# Patient Record
Sex: Female | Born: 1970 | Race: Black or African American | Hispanic: No | Marital: Married | Smoking: Current some day smoker
Health system: Southern US, Community
[De-identification: ages and names within clinical notes are randomized; demographics above are authoritative.]

## PROBLEM LIST (undated history)

## (undated) ENCOUNTER — Ambulatory Visit

## (undated) DIAGNOSIS — Z319 Encounter for procreative management, unspecified: Secondary | ICD-10-CM

## (undated) DIAGNOSIS — I10 Essential (primary) hypertension: Secondary | ICD-10-CM

## (undated) DIAGNOSIS — N96 Recurrent pregnancy loss: Principal | ICD-10-CM

## (undated) DIAGNOSIS — R760 Raised antibody titer: Secondary | ICD-10-CM

## (undated) DIAGNOSIS — D219 Benign neoplasm of connective and other soft tissue, unspecified: Secondary | ICD-10-CM

## (undated) HISTORY — DX: Raised antibody titer: R76.0

## (undated) HISTORY — DX: Recurrent pregnancy loss: N96

## (undated) HISTORY — PX: UTERINE FIBROID SURGERY: SHX826

## (undated) HISTORY — PX: TOOTH EXTRACTION: SUR596

---

## 2004-11-28 ENCOUNTER — Emergency Department (HOSPITAL_COMMUNITY): Admission: EM | Admit: 2004-11-28 | Discharge: 2004-11-28 | Payer: Self-pay | Admitting: Emergency Medicine

## 2005-11-26 ENCOUNTER — Other Ambulatory Visit: Admission: RE | Admit: 2005-11-26 | Discharge: 2005-11-26 | Payer: Self-pay | Admitting: Obstetrics and Gynecology

## 2005-12-29 ENCOUNTER — Ambulatory Visit (HOSPITAL_COMMUNITY): Admission: RE | Admit: 2005-12-29 | Discharge: 2005-12-29 | Payer: Self-pay | Admitting: Obstetrics and Gynecology

## 2005-12-29 ENCOUNTER — Encounter (INDEPENDENT_AMBULATORY_CARE_PROVIDER_SITE_OTHER): Payer: Self-pay | Admitting: *Deleted

## 2006-04-20 ENCOUNTER — Emergency Department (HOSPITAL_COMMUNITY): Admission: EM | Admit: 2006-04-20 | Discharge: 2006-04-20 | Payer: Self-pay | Admitting: Emergency Medicine

## 2006-04-28 ENCOUNTER — Emergency Department (HOSPITAL_COMMUNITY): Admission: EM | Admit: 2006-04-28 | Discharge: 2006-04-28 | Payer: Self-pay | Admitting: Family Medicine

## 2006-07-18 ENCOUNTER — Emergency Department (HOSPITAL_COMMUNITY): Admission: EM | Admit: 2006-07-18 | Discharge: 2006-07-18 | Payer: Self-pay | Admitting: Family Medicine

## 2008-10-09 ENCOUNTER — Emergency Department (HOSPITAL_COMMUNITY): Admission: EM | Admit: 2008-10-09 | Discharge: 2008-10-09 | Payer: Self-pay | Admitting: Emergency Medicine

## 2010-03-06 ENCOUNTER — Emergency Department (HOSPITAL_BASED_OUTPATIENT_CLINIC_OR_DEPARTMENT_OTHER)
Admission: EM | Admit: 2010-03-06 | Discharge: 2010-03-06 | Payer: Self-pay | Source: Home / Self Care | Admitting: Emergency Medicine

## 2010-06-01 LAB — URINE MICROSCOPIC-ADD ON

## 2010-06-01 LAB — URINALYSIS, ROUTINE W REFLEX MICROSCOPIC
Bilirubin Urine: NEGATIVE
Glucose, UA: NEGATIVE mg/dL
Protein, ur: NEGATIVE mg/dL

## 2010-06-01 LAB — PREGNANCY, URINE: Preg Test, Ur: NEGATIVE

## 2010-08-07 NOTE — H&P (Signed)
Brenda Bowen, Brenda Bowen                ACCOUNT NO.:  [id number]   MEDICAL RECORD NO.:  [id number]          PATIENT TYPE:  AMB   LOCATION:  SDC                           FACILITY:  [facility]   PHYSICIAN:  Brenda A. Dillard, M.D. DATE OF BIRTH:  1970-12-13   DATE OF ADMISSION:  DATE OF DISCHARGE:                                HISTORY & PHYSICAL   CHIEF COMPLAINT:  Menometrorrhagia and uterine fibroid.   HISTORY:  The patient is a 40 year old African-American female, gravida 0  who presented complaining of irregular vaginal bleeding and infertility.  The patient states that she was soaking about a pad every hour and has some  weakness and occasional shortness of breath with exertion.  She had taken  Tylenol for the cramping. The patient any oral contraception.  She does have  a history of fibroid.  She denies any menopausal symptoms, vaginal  discharge.  She does have abdominal cramping with her periods.  The patient  had an endometrial biopsy which was found to be benign on 12/13/05 and was  given a pill taper and the pill taper did slow down her bleeding, but she  still continues to have bleeding in spite of taking the pill taper.  The  patient had an ultrasound on 12/22/05 which revealed the uterus to measure  10.2 x 5.5 x 7.1  The two ovaries were normal.  Two fibroids, one anterior  measuring 2.69, one posterior measuring 2.52 and both destroyed the  endometrial lining.  The patient was advised to have  D&C, hysterectomy,  possible myomectomy in order to help stop the bleeding.   PAST MEDICAL HISTORY:  Significant for polycystic ovarian syndrome.   PAST SURGICAL HISTORY:  Unremarkable.   SOCIAL HISTORY:  Positive for tobacco use, half a pack a day.  Occasional  alcohol use.  No illicit drug use.   REVIEW OF SYSTEMS:  Cardiovascular;  Denies any heart palpitations  Endocrine:  Denies history of diabetes.  Respiratory:  Unremarkable.  No  asthma.  GI:  Remarkable for IBS.  Musculoskeletal:   Remarkable for  weakness.  The patient has never seen a psychiatrist for anything.   LABORATORY DATA:  Last hemoglobin was 13.0.  The patient is insulin-  resistant.  TSH and prolactin were normal.   PHYSICAL EXAMINATION:  VITAL SIGNS:  Blood pressure 120/80.  HEENT:  Pupils are equal. Hearing is normal.  Throat is clear.  Thyroid is  not enlarged.  HEART:  Regular rate and rhythm.  LUNGS:  Clear to auscultation bilaterally.  BREASTS:  No masses, discharge, skin changes or nipple retraction.  BACK:  No CVA tenderness bilaterally.  ABDOMEN:  Nontender without masses or organomegaly.  EXTREMITIES:  No clubbing, cyanosis or edema.  VAGINAL EXAM:  There is moderate vaginal bleeding. Vagina and cervix normal,  nontender, without lesions.  Uterus top normal size, nontender.  Adnexa with  no masses and nontender.   ASSESSMENT:  Menometrorrhagia and fibroids.   PLAN:  Endometrial biopsy was benign.  Plan D&C and hysteroscopy, possible  hysteroscopic myomectomy.  The patient that the risks are, but  not limited  to, bleeding, infection, Asherman syndrome, perforation of the uterus and  infertility.      Brenda Bowen, M.D.  Electronically Signed     NAD/MEDQ  D:  12/28/2005  T:  12/29/2005  Job:  [id number]

## 2010-08-07 NOTE — Op Note (Signed)
NAMECAYLEIGH, PAULL                ACCOUNT NO.:  [id number]   MEDICAL RECORD NO.:  [id number]          PATIENT TYPE:  AMB   LOCATION:  SDC                           FACILITY:  [facility]   PHYSICIAN:  Naima A. Dillard, M.D. DATE OF BIRTH:  March 29, 1970   DATE OF PROCEDURE:  12/29/2005  DATE OF DISCHARGE:                                 OPERATIVE REPORT   PREOPERATIVE DIAGNOSIS:  Menometrorrhagia with uterine fibroids.   POSTOPERATIVE DIAGNOSIS:  Menometrorrhagia with uterine fibroids.   PROCEDURE:  Dilatation and curettage, hysteroscopy.   SURGEON:  Naima A. Dillard, M.D.   ASSISTANT:  None.   ANESTHESIA:  General laryngeal mask airway and local anesthesia.   SPECIMENS:  Endometrial curettings, sent to pathology.   ESTIMATED BLOOD LOSS:  Minimal.   COMPLICATIONS:  None.  Patient to PACU in stable condition.   PROCEDURE IN DETAIL:  Patient was taken to the operating room.  She was  placed in the dorsal lithotomy position, prepped and draped in the normal  sterile fashion.  Bladder was drained with a straight catheter.  A bivalve  speculum was placed into the vagina, and the anterior lip of the cervix was  grasped with a single-tooth tenaculum.  Lidocaine 1% 10 cc was used for a  cervical block.  The uterus was sounded to 9 cm.  The cervix was further  dilated with Pratt dilators up to 21.  Hysteroscope was placed into the  uterine cavity.  Both ostia were visualized.  There was a slight concave  aspect to the anterior wall of the uterus.  No polyps were seen, and the  remaining wall of the uterus seems to be normal.  I was unsure if this was  part of the submucosal aspect of the fibroid or not, so the hysteroscope was  removed.  A sharp curettage was done.  I could not feel any difference in  the contour.  After the curettage, I did look again, and the contour of the  anterior wall was different, it was flat and did not appear to be a  submucosal fibroid.  So the hysteroscope was  removed.  Then another  curettage was done until a gritty texture was noted.  All instruments were  removed from the vagina.  The tenaculum was removed from the cervix with  hemostasis noted.  Sponge, lap, and needle counts were correct.  Patient  went to the recovery room in stable condition.      Naima A. Normand Sloop, M.D.  Electronically Signed     NAD/MEDQ  D:  12/29/2005  T:  12/30/2005  Job:  [id number]

## 2011-02-05 ENCOUNTER — Encounter (HOSPITAL_COMMUNITY): Payer: Self-pay | Admitting: *Deleted

## 2011-02-05 ENCOUNTER — Inpatient Hospital Stay (HOSPITAL_COMMUNITY)
Admission: AD | Admit: 2011-02-05 | Discharge: 2011-02-06 | Disposition: A | Discharge status: Home / Self Care | Payer: BC Managed Care – PPO | Source: Ambulatory Visit | Attending: Obstetrics & Gynecology | Admitting: Obstetrics & Gynecology

## 2011-02-05 ENCOUNTER — Emergency Department (HOSPITAL_COMMUNITY)
Admission: EM | Admit: 2011-02-05 | Discharge: 2011-02-05 | Disposition: A | Discharge status: Home Health | Payer: BC Managed Care – PPO | Source: Home / Self Care

## 2011-02-05 DIAGNOSIS — O200 Threatened abortion: Secondary | ICD-10-CM | POA: Insufficient documentation

## 2011-02-05 DIAGNOSIS — B9689 Other specified bacterial agents as the cause of diseases classified elsewhere: Secondary | ICD-10-CM

## 2011-02-05 DIAGNOSIS — N760 Acute vaginitis: Secondary | ICD-10-CM

## 2011-02-05 DIAGNOSIS — O26899 Other specified pregnancy related conditions, unspecified trimester: Secondary | ICD-10-CM

## 2011-02-05 DIAGNOSIS — O209 Hemorrhage in early pregnancy, unspecified: Secondary | ICD-10-CM

## 2011-02-05 HISTORY — DX: Encounter for procreative management, unspecified: Z31.9

## 2011-02-05 HISTORY — DX: Essential (primary) hypertension: I10

## 2011-02-05 HISTORY — DX: Benign neoplasm of connective and other soft tissue, unspecified: D21.9

## 2011-02-05 LAB — WET PREP, GENITAL: Trich, Wet Prep: NONE SEEN

## 2011-02-05 LAB — HCG, QUANTITATIVE, PREGNANCY: hCG, Beta Chain, Quant, S: 3494 m[IU]/mL — ABNORMAL HIGH (ref <5)

## 2011-02-05 NOTE — Progress Notes (Signed)
PT SAYS  ON Thursday- - HAD ABD CRAMPS - THOUGHT CYCLE WAS STARTING- HAD BEEN SPOTTING. .   THEN TODAY- STILL SPOTTING-  - ALTHOUGH THIS AM WAS  HEAVY LIKE CYCLE- BUT LOOKED DIFFERENT.   THEN STOPPED.  THIS AFTERNOON- WENT -DID HOME UPT- POSTIVE.  FELT DIZZY X2 WEEKS- AND HUSBAND DIZZY TOO.  DENIES N/V/D. .    NOW IN TRIAGE-  SAYS BLEEDING IS LIGHT - HAS ON PAD.

## 2011-02-05 NOTE — Progress Notes (Signed)
WAS SEEN   AT [facility] IN [place]

## 2011-02-05 NOTE — ED Notes (Signed)
Patient LWBS

## 2011-02-05 NOTE — ED Provider Notes (Signed)
History   Brenda Bowen is a 40 y.o. year old [id number] female at [redacted]w[redacted]d weeks gestation who presents to [facility] reporting Vaginal bleeding since yesterday.  CSN: [id number] Arrival date & time: 02/05/2011  8:15 PM  HPI  Past Medical History  Diagnosis Date  . Fibroid   . Headache   . Hypertension   . Infertility management     Past Surgical History  Procedure Date  . Tooth extraction   . Uterine fibroid surgery     No family history on file.  History  Substance Use Topics  . Smoking status: Current Everyday Smoker -- 0.5 packs/day  . Smokeless tobacco: Not on file  . Alcohol Use: No    OB History    Grav Para Term Preterm Abortions TAB SAB Ect Mult Living   2    1 1     0      Review of Systems: Denies cramping or passage of tissue.  Allergies  Review of patient's allergies indicates no known allergies.  Home Medications   Current Outpatient Rx  Name Route Sig Dispense Refill  . IBUPROFEN 200 MG PO TABS Oral Take 200 mg by mouth every 6 (six) hours as needed. Takes for pain       BP 153/94  Pulse 88  Temp(Src) 98.8 F (37.1 C) (Oral)  Resp 18  Ht 5\' 2"  (1.575 m)  Wt 82.555 kg (182 lb)  BMI 33.29 kg/m2  LMP 12/26/2010    Physical Exam:  Physical Examination: General appearance - alert, well appearing, and in no distress and oriented to person, place, and time Abdomen - soft, nontender, nondistended, no masses or organomegaly Pelvic -   VULVA: normal appearing vulva with no masses, tenderness or lesions,   VAGINA: normal appearing vagina with small amount of pinkish-brown discharge, no lesions,   CERVIX: normal appearing cervix without discharge or lesions, cervical motion tenderness absent, nulliparous os,  UTERUS: uterus is normal size, shape, consistency and nontender,   ADNEXA: normal adnexa in size, nontender and no masses   ED Course  Procedures (including critical care time)  Results for orders placed during the hospital encounter of 02/05/11  (from the past 48 hour(s))  POCT PREGNANCY, URINE     Status: Normal   Collection Time   02/05/11  9:24 PM      Component Value Range Comment   Preg Test, Ur POSITIVE     ABO/RH     Status: Normal   Collection Time   02/05/11  9:42 PM      Component Value Range Comment   ABO/RH(D) A POS     WET PREP, GENITAL     Status: Abnormal   Collection Time   02/05/11 10:15 PM      Component Value Range Comment   Yeast, Wet Prep NONE SEEN  NONE SEEN     Trich, Wet Prep NONE SEEN  NONE SEEN     Clue Cells, Wet Prep FEW (*) NONE SEEN     WBC, Wet Prep HPF POC FEW (*) NONE SEEN  NONE SEEN                           HCG, QUANTITATIVE, PREGNANCY     Status: Abnormal   Collection Time   02/05/11 10:19 PM      Component Value Range Comment   hCG, Beta Chain, Quant, S 3494 (*) <5 (mIU/mL)      MDM  Assessment:  1. 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 Threatened AB  Plan: 1. D/C home per consult w/ Dr. Tamela Oddi 2. F/U w/ Dr. Tamela Oddi next week 3. SAB precautions  Katrinka Blazing, Jahlil Ziller 02/06/2011 1:55 AM

## 2011-02-06 ENCOUNTER — Inpatient Hospital Stay (HOSPITAL_COMMUNITY): Payer: BC Managed Care – PPO

## 2011-02-06 DIAGNOSIS — O209 Hemorrhage in early pregnancy, unspecified: Secondary | ICD-10-CM

## 2011-02-06 LAB — POCT PREGNANCY, URINE: Preg Test, Ur: POSITIVE

## 2011-02-06 MED ORDER — METRONIDAZOLE 500 MG PO TABS: 500.0000 mg | ORAL_TABLET | Freq: Two times a day (BID) | ORAL | Status: AC

## 2011-04-11 ENCOUNTER — Emergency Department (INDEPENDENT_AMBULATORY_CARE_PROVIDER_SITE_OTHER)
Admission: EM | Admit: 2011-04-11 | Discharge: 2011-04-11 | Disposition: A | Discharge status: Home / Self Care | Payer: BC Managed Care – PPO | Source: Home / Self Care

## 2011-04-11 ENCOUNTER — Encounter (HOSPITAL_COMMUNITY): Payer: Self-pay

## 2011-04-11 DIAGNOSIS — K089 Disorder of teeth and supporting structures, unspecified: Secondary | ICD-10-CM

## 2011-04-11 DIAGNOSIS — K0889 Other specified disorders of teeth and supporting structures: Secondary | ICD-10-CM

## 2011-04-11 MED ORDER — IBUPROFEN 800 MG PO TABS: 800.0000 mg | ORAL_TABLET | Freq: Three times a day (TID) | ORAL | Status: AC

## 2011-04-11 MED ORDER — TRAMADOL HCL 50 MG PO TABS: 50.0000 mg | ORAL_TABLET | Freq: Four times a day (QID) | ORAL | Status: AC | PRN

## 2011-04-11 MED ORDER — PENICILLIN V POTASSIUM 500 MG PO TABS: 500.0000 mg | ORAL_TABLET | Freq: Three times a day (TID) | ORAL | Status: AC

## 2011-04-11 NOTE — ED Provider Notes (Signed)
History     CSN: [id number]  Arrival date & time 04/11/11  0947   None     Chief Complaint  Patient presents with  . Dental Pain    toothache, swelling    (Consider location/radiation/quality/duration/timing/severity/associated sxs/prior treatment) HPI Comments: Patient presents with complaints of pain in her right upper canine tooth for the last 2-3 days. Today she's noticed right facial swelling. She states that she had pain in his tooth approximately 2 months ago and was evaluated by her dentist. She was advised that she needed to have a root canal but did not followup. She did call her dentist this week when the pain again started and was advised she would have to be referred for root canal.   Past Medical History  Diagnosis Date  . Fibroid   . Headache   . Hypertension   . Infertility management     Past Surgical History  Procedure Date  . Tooth extraction   . Uterine fibroid surgery     No family history on file.  History  Substance Use Topics  . Smoking status: Current Everyday Smoker -- 0.5 packs/day    Types: Cigarettes  . Smokeless tobacco: Not on file  . Alcohol Use: Yes     ocas.    OB History    Grav Para Term Preterm Abortions TAB SAB Ect Mult Living   2    1 1     0      Review of Systems  Constitutional: Negative for fever and chills.  HENT: Negative for ear pain, congestion, sore throat and sinus pressure.     Allergies  Review of patient's allergies indicates no known allergies.  Home Medications   Current Outpatient Rx  Name Route Sig Dispense Refill  . IBUPROFEN 800 MG PO TABS Oral Take 1 tablet (800 mg total) by mouth 3 (three) times daily. 15 tablet 0  . PENICILLIN V POTASSIUM 500 MG PO TABS Oral Take 1 tablet (500 mg total) by mouth 3 (three) times daily. 30 tablet 0  . TRAMADOL HCL 50 MG PO TABS Oral Take 1 tablet (50 mg total) by mouth every 6 (six) hours as needed for pain. 12 tablet 0    BP 136/83  Pulse 78  Temp(Src) 99.1  F (37.3 C) (Oral)  Resp 16  SpO2 100%  LMP 12/26/2010  Breastfeeding? No  Physical Exam  Nursing note and vitals reviewed. Constitutional: She appears well-developed and well-nourished. No distress.  HENT:  Head: Normocephalic and atraumatic.  Right Ear: Tympanic membrane, external ear and ear canal normal.  Left Ear: Tympanic membrane, external ear and ear canal normal.  Nose: Nose normal.  Mouth/Throat: Uvula is midline, oropharynx is clear and moist and mucous membranes are normal. No oral lesions. Normal dentition. No dental abscesses. No oropharyngeal exudate, posterior oropharyngeal edema or posterior oropharyngeal erythema.       Minimal swelling noted Rt face. No gum swelling or erythema. Rt upper canine tender and remainder of teeth nontender.   Neck: Neck supple.  Cardiovascular: Normal rate, regular rhythm and normal heart sounds.   Pulmonary/Chest: Effort normal and breath sounds normal. No respiratory distress.  Lymphadenopathy:    She has no cervical adenopathy.  Neurological: She is alert.  Skin: Skin is warm and dry.  Psychiatric: She has a normal mood and affect.    ED Course  Procedures (including critical care time)   Labs Reviewed  POCT PREGNANCY, URINE  POCT PREGNANCY, URINE   No results  found.   1. Pain, dental       MDM  Rt canine dental pain. To f/u with dentist.         Melody Comas, PA 04/11/11 1144

## 2011-04-11 NOTE — ED Notes (Signed)
Toothache and facial swelling started on friday

## 2011-04-13 NOTE — ED Provider Notes (Signed)
Medical screening examination/treatment/procedure(s) were performed by non-physician practitioner and as supervising physician I was immediately available for consultation/collaboration.   KINDL,JAMES DOUGLAS MD.    James Douglas Kindl, MD 04/13/11 0818 

## 2011-05-09 ENCOUNTER — Encounter (HOSPITAL_COMMUNITY): Payer: Self-pay | Admitting: Family Medicine

## 2011-05-09 ENCOUNTER — Emergency Department (HOSPITAL_COMMUNITY)
Admission: EM | Admit: 2011-05-09 | Discharge: 2011-05-09 | Disposition: A | Discharge status: Home / Self Care | Payer: BC Managed Care – PPO | Attending: Emergency Medicine | Admitting: Emergency Medicine

## 2011-05-09 DIAGNOSIS — I10 Essential (primary) hypertension: Secondary | ICD-10-CM | POA: Insufficient documentation

## 2011-05-09 DIAGNOSIS — K089 Disorder of teeth and supporting structures, unspecified: Secondary | ICD-10-CM | POA: Insufficient documentation

## 2011-05-09 DIAGNOSIS — K047 Periapical abscess without sinus: Secondary | ICD-10-CM | POA: Insufficient documentation

## 2011-05-09 DIAGNOSIS — R220 Localized swelling, mass and lump, head: Secondary | ICD-10-CM | POA: Insufficient documentation

## 2011-05-09 DIAGNOSIS — F17200 Nicotine dependence, unspecified, uncomplicated: Secondary | ICD-10-CM | POA: Insufficient documentation

## 2011-05-09 MED ORDER — HYDROCODONE-ACETAMINOPHEN 5-325 MG PO TABS
1.0000 | ORAL_TABLET | Freq: Once | ORAL | Status: AC
  Administered 2011-05-09: 1 via ORAL
  Filled 2011-05-09: qty 1

## 2011-05-09 MED ORDER — HYDROCODONE-ACETAMINOPHEN 5-325 MG PO TABS: 1.0000 | ORAL_TABLET | ORAL | Status: AC | PRN

## 2011-05-09 MED ORDER — CLINDAMYCIN HCL 300 MG PO CAPS: 300.0000 mg | ORAL_CAPSULE | Freq: Three times a day (TID) | ORAL | Status: AC

## 2011-05-09 MED ORDER — CLINDAMYCIN HCL 300 MG PO CAPS
300.0000 mg | ORAL_CAPSULE | Freq: Once | ORAL | Status: AC
  Administered 2011-05-09: 300 mg via ORAL
  Filled 2011-05-09: qty 1

## 2011-05-09 NOTE — ED Notes (Signed)
Pt. Reports that she had pain this AM at about 3am and took pain medicine for it.  Pt. Also reports that she has a knot in the "roof of her mouth that has gotten bigger.  Pt. Is noted with swelling to the right side of her face that has caused her "nose to look one sided."  Pt. reports that she feels like the swelling is starting to affect her breathing.

## 2011-05-09 NOTE — ED Provider Notes (Signed)
History     CSN: [id number]  Arrival date & time 05/09/11  0711   First MD Initiated Contact with Patient 05/09/11 [phone]      Chief Complaint  Patient presents with  . Facial Swelling    (Consider location/radiation/quality/duration/timing/severity/associated sxs/prior treatment) Patient is a 41 y.o. female presenting with tooth pain. The history is provided by the patient.  Dental PainThe primary symptoms include mouth pain. Primary symptoms do not include oral bleeding or fever. Primary symptoms comment: She has been treated for a dental abscess and stopped taking antibiotics. Now with recurrent pain and facial swelling. The symptoms began 6 to 12 hours ago. The symptoms are worsening. The symptoms occur constantly.  Additional symptoms include: gum swelling and facial swelling. Additional symptoms do not include: trismus and trouble swallowing.    Past Medical History  Diagnosis Date  . Fibroid   . Headache   . Hypertension   . Infertility management     Past Surgical History  Procedure Date  . Tooth extraction   . Uterine fibroid surgery     History reviewed. No pertinent family history.  History  Substance Use Topics  . Smoking status: Current Everyday Smoker -- 0.5 packs/day    Types: Cigarettes  . Smokeless tobacco: Not on file  . Alcohol Use: Yes     ocas.    OB History    Grav Para Term Preterm Abortions TAB SAB Ect Mult Living   2    1 1     0      Review of Systems  Constitutional: Negative for fever and chills.  HENT: Positive for facial swelling and dental problem. Negative for trouble swallowing.   Respiratory: Negative.   Cardiovascular: Negative.   Gastrointestinal: Negative.   Musculoskeletal: Negative.   Skin: Negative.   Neurological: Negative.     Allergies  Review of patient's allergies indicates no known allergies.  Home Medications  No current outpatient prescriptions on file.  BP 149/105  Pulse 99  Temp(Src) 98.2 F (36.8 C)  (Oral)  Resp 22  SpO2 98%  LMP 04/03/2011  Physical Exam  Constitutional: She is oriented to person, place, and time. She appears well-developed and well-nourished.  HENT:       Right sided maxillary facial swelling. No cutaneous abscess palpable. Generally good dentition with alveolar ridge swelling adjacent to #4. No visualized drainable abscess.  Neck: Normal range of motion.  Pulmonary/Chest: Effort normal.  Neurological: She is alert and oriented to person, place, and time.  Skin: Skin is warm and dry.    ED Course  Procedures (including critical care time)  Labs Reviewed - No data to display No results found.   No diagnosis found.    MDM          Rodena Medin, PA-C 05/09/11 [phone]

## 2011-05-09 NOTE — Discharge Instructions (Signed)
KEEP SCHEDULED APPOINTMENT WITH THE DENTIST THIS WEEK. TAKE ANTIBIOTICS UNTIL COMPLETED. RETURN HERE AS NEEDED.  Dental Abscess A dental abscess usually starts from an infected tooth. Antibiotic medicine and pain pills can be helpful, but dental infections require the attention of a dentist. Rinse around the infected area often with salt water (a pinch of salt in 8 oz of warm water). Do not apply heat to the outside of your face. See your dentist or oral surgeon as soon as possible.  SEEK IMMEDIATE MEDICAL CARE IF:  You have increasing, severe pain that is not relieved by medicine.   You or your child has an oral temperature above 102 F (38.9 C), not controlled by medicine.   Your baby is older than 3 months with a rectal temperature of 102 F (38.9 C) or higher.   Your baby is 19 months old or younger with a rectal temperature of 100.4 F (38 C) or higher.   You develop chills, severe headache, difficulty breathing, or trouble swallowing.   You have swelling in the neck or around the eye.  Document Released: 03/08/2005 Document Revised: 11/18/2010 Document Reviewed: 08/17/2006 [facility] Patient Information 2012 [place], [place].   Antibiotic Resistance Antibiotics are drugs. They fight infections caused by bacteria. Antibiotics greatly reduce illness and death from infectious diseases. Over time, the bacteria that antibiotics once controlled are much harder to kill. CAUSES  Antibiotic resistance occurs when bacteria change in some way. These changes can lessen the abilities of drugs designed to cure infections. The over-use of antibiotics can cause antibiotic resistance. Almost all important bacterial infections in the world are becoming resistant to drugs. Antibiotic resistance has been called one of the world's most pressing public health problems.  Antibiotics should be used to treat bacterial infections. But they are not effective against viral infections. These include the common  cold, most sore throats, and the flu. Smart use of antibiotics will control the spread of resistance.  TREATMENT   Only use antibiotics as prescribed by your caregiver.   Talk with your caregiver about antibiotic resistance.   Ask what else you can do to feel better.   Do not take an antibiotic for a viral infection. This could be a cold, cough or the flu.   Do not save some of your antibiotic for the next time you get sick.   Take an antibiotic exactly as the caregiver tells you.   Do not take an antibiotic that is prescribed for someone else.   Use the antibiotic as directed. Take the correct dose at the scheduled time.  SEEK MEDICAL CARE IF:  You react to the antibiotic with:   A rash.   Itching.   An upset stomach.  Document Released: 05/29/2002 Document Revised: 11/18/2010 Document Reviewed: 01/01/2008 [facility] Patient Information 2012 [place], [place].

## 2011-05-09 NOTE — ED Notes (Signed)
Per pt has been treated for tooth infection and noticed swelling yesterday. sts took benadryl before she came. sts also applied heat and thinks that is what caused it

## 2011-05-18 NOTE — ED Provider Notes (Signed)
Evaluation and management procedures were performed by the PA/NP under my supervision/collaboration.   Sacha Topor D Devonta Blanford, MD 05/18/11 2011 

## 2011-07-19 ENCOUNTER — Encounter (HOSPITAL_COMMUNITY): Payer: Self-pay

## 2011-07-19 ENCOUNTER — Emergency Department (INDEPENDENT_AMBULATORY_CARE_PROVIDER_SITE_OTHER): Payer: BC Managed Care – PPO

## 2011-07-19 ENCOUNTER — Emergency Department (HOSPITAL_COMMUNITY)
Admission: EM | Admit: 2011-07-19 | Discharge: 2011-07-19 | Disposition: A | Discharge status: Home / Self Care | Payer: BC Managed Care – PPO | Source: Home / Self Care | Attending: Emergency Medicine | Admitting: Emergency Medicine

## 2011-07-19 DIAGNOSIS — J40 Bronchitis, not specified as acute or chronic: Secondary | ICD-10-CM

## 2011-07-19 LAB — POCT URINALYSIS DIP (DEVICE)
Glucose, UA: NEGATIVE mg/dL
Hgb urine dipstick: NEGATIVE
Nitrite: NEGATIVE
pH: 5.5 (ref 5.0–8.0)

## 2011-07-19 MED ORDER — HYDROCODONE-ACETAMINOPHEN 7.5-500 MG/15ML PO SOLN: 5.0000 mL | Freq: Four times a day (QID) | ORAL | Status: AC | PRN

## 2011-07-19 MED ORDER — IPRATROPIUM BROMIDE 0.02 % IN SOLN
0.5000 mg | Freq: Once | RESPIRATORY_TRACT | Status: AC
  Administered 2011-07-19: 0.5 mg via RESPIRATORY_TRACT

## 2011-07-19 MED ORDER — ALBUTEROL SULFATE HFA 108 (90 BASE) MCG/ACT IN AERS: 1.0000 | INHALATION_SPRAY | Freq: Four times a day (QID) | RESPIRATORY_TRACT | Status: DC | PRN

## 2011-07-19 MED ORDER — ALBUTEROL SULFATE (5 MG/ML) 0.5% IN NEBU
5.0000 mg | INHALATION_SOLUTION | Freq: Once | RESPIRATORY_TRACT | Status: AC
  Administered 2011-07-19: 5 mg via RESPIRATORY_TRACT

## 2011-07-19 MED ORDER — AZITHROMYCIN 250 MG PO TABS: 250.0000 mg | ORAL_TABLET | Freq: Every day | ORAL | Status: AC

## 2011-07-19 MED ORDER — DEXAMETHASONE 4 MG PO TABS: ORAL_TABLET | ORAL | Status: AC

## 2011-07-19 MED ORDER — ALBUTEROL SULFATE (5 MG/ML) 0.5% IN NEBU
INHALATION_SOLUTION | RESPIRATORY_TRACT | Status: AC
  Filled 2011-07-19: qty 1

## 2011-07-19 NOTE — ED Notes (Signed)
C/o runny nose, sneezing, nasal congestion, cough,, and body aches since Friday.

## 2011-07-19 NOTE — ED Provider Notes (Signed)
History     CSN: [id number]  Arrival date & time 07/19/11  9147   First MD Initiated Contact with Patient 07/19/11 1010      Chief Complaint  Patient presents with  . Cough  . Generalized Body Aches  . URI    (Consider location/radiation/quality/duration/timing/severity/associated sxs/prior treatment) HPI Comments: Patient reports rhinorrhea, nasal congestion, nonproductive cough, myalgias, chest tightness which is worse at night, wheezing for 3 days. States she is unable to sleep at night secondary to all the coughing. Reports abdominal "soreness", chest soreness. Some mid back pain as well. Reports fevers Tmax 102 the beginning of the illness. Reports lightheadedness when she stands up. Is taking TheraFlu. No ear pain, sinus pain/pressure, purulent nasal discharge, sore throat, abdominal pain,  urinary complaints. No known sick contacts. Patient is a smoker.  ROS as noted in HPI. All other ROS negative.   Patient is a 41 y.o. female presenting with cough and URI. The history is provided by the patient. No language interpreter was used.  Cough This is a new problem. The current episode started more than 2 days ago. The problem occurs constantly. The problem has not changed since onset.The cough is non-productive. Associated symptoms include chills, myalgias, shortness of breath and wheezing. Pertinent negatives include no chest pain, no ear pain, no headaches, no rhinorrhea and no sore throat. She has tried cough syrup for the symptoms. The treatment provided mild relief. She is a smoker. Her past medical history does not include pneumonia, COPD, emphysema or asthma.  URI The primary symptoms include cough, wheezing and myalgias. Primary symptoms do not include headaches, ear pain or sore throat.  The patient's medical history does not include asthma or COPD.  Symptoms associated with the illness include chills. The illness is not associated with rhinorrhea.    Past Medical History    Diagnosis Date  . Fibroid   . Headache   . Hypertension   . Infertility management     Past Surgical History  Procedure Date  . Tooth extraction   . Uterine fibroid surgery     History reviewed. No pertinent family history.  History  Substance Use Topics  . Smoking status: Current Everyday Smoker -- 0.5 packs/day    Types: Cigarettes  . Smokeless tobacco: Not on file  . Alcohol Use: Yes     ocas.    OB History    Grav Para Term Preterm Abortions TAB SAB Ect Mult Living   2    1 1     0      Review of Systems  Constitutional: Positive for chills.  HENT: Negative for ear pain, sore throat and rhinorrhea.   Respiratory: Positive for cough, shortness of breath and wheezing.   Cardiovascular: Negative for chest pain.  Musculoskeletal: Positive for myalgias.  Neurological: Negative for headaches.    Allergies  Review of patient's allergies indicates no known allergies.  Home Medications   Current Outpatient Rx  Name Route Sig Dispense Refill  . THERAFLU FLU/COLD PO Oral Take by mouth.    . GUAIFENESIN 100 MG/5ML PO LIQD Oral Take 200 mg by mouth 3 (three) times daily as needed.    . IBUPROFEN 200 MG PO TABS Oral Take 800 mg by mouth every 6 (six) hours as needed. For pain    . DIPHENHYDRAMINE HCL 25 MG PO TABS Oral Take 50 mg by mouth every 6 (six) hours as needed. For swelling    . PENICILLIN V POTASSIUM 500 MG PO TABS Oral  Take 500 mg by mouth 3 (three) times daily.    . TRAMADOL HCL 50 MG PO TABS Oral Take 50 mg by mouth every 6 (six) hours as needed. For pain      BP 136/83  Pulse 90  Temp(Src) 99.2 F (37.3 C) (Oral)  Resp 20  SpO2 100%  LMP 06/26/2011  Physical Exam  Nursing note and vitals reviewed. Constitutional: She is oriented to person, place, and time. She appears well-developed and well-nourished.  HENT:  Head: Normocephalic and atraumatic.  Right Ear: Tympanic membrane and ear canal normal.  Left Ear: Tympanic membrane and ear canal  normal.  Nose: Rhinorrhea present.  Mouth/Throat: Uvula is midline, oropharynx is clear and moist and mucous membranes are normal.       (-) frontal, maxillary sinus tenderness  Eyes: Conjunctivae and EOM are normal.  Neck: Normal range of motion. Neck supple.  Cardiovascular: Normal rate, regular rhythm and normal heart sounds.   Pulmonary/Chest: Effort normal. No respiratory distress. She has no wheezes. She has no rhonchi. She has no rales.       Fair air movement. Diffuse chest wall tenderness.  Abdominal: Soft. Bowel sounds are normal. She exhibits no distension. There is tenderness. There is no rebound, no guarding and no CVA tenderness.       Mild upper abdominal tenderness. No suprapubic tenderness.  Musculoskeletal: Normal range of motion.  Neurological: She is alert and oriented to person, place, and time.  Skin: Skin is warm and dry. No rash noted.  Psychiatric: She has a normal mood and affect. Her behavior is normal. Judgment and thought content normal.    ED Course  Procedures (including critical care time)  Labs Reviewed  POCT URINALYSIS DIP (DEVICE) - Abnormal; Notable for the following:    Bilirubin Urine SMALL (*)    Ketones, ur TRACE (*)    Protein, ur 100 (*)    Leukocytes, UA TRACE (*) Biochemical Testing Only. Please order routine urinalysis from main lab if confirmatory testing is needed.   All other components within normal limits  POCT PREGNANCY, URINE   Dg Chest 2 View  07/19/2011  *RADIOLOGY REPORT*  Clinical Data: Cough and fever 1202  CHEST - 2 VIEW  Comparison: None.  Findings: Heart size is normal.  Thoracic aorta contour and hilar contours are normal.  Normal pulmonary vascularity.  The trachea is midline. The lungs are clear.  There is a slight convex right scoliosis of the upper thoracic spine and slight convex left scoliosis of the lumbar lower thoracic spine.  IMPRESSION:  1.  No acute cardiopulmonary disease. 2.  Slight scoliosis.  Original Report  Authenticated By: Britta Mccreedy, M.D.     1. Bronchitis     Results for orders placed during the hospital encounter of 07/19/11  POCT URINALYSIS DIP (DEVICE)      Component Value Range   Glucose, UA NEGATIVE  NEGATIVE (mg/dL)   Bilirubin Urine SMALL (*) NEGATIVE    Ketones, ur TRACE (*) NEGATIVE (mg/dL)   Specific Gravity, Urine >=1.030  1.005 - 1.030    Hgb urine dipstick NEGATIVE  NEGATIVE    pH 5.5  5.0 - 8.0    Protein, ur 100 (*) NEGATIVE (mg/dL)   Urobilinogen, UA 0.2  0.0 - 1.0 (mg/dL)   Nitrite NEGATIVE  NEGATIVE    Leukocytes, UA TRACE (*) NEGATIVE   POCT PREGNANCY, URINE      Component Value Range   Preg Test, Ur NEGATIVE  NEGATIVE  Dg Chest 2 View  07/19/2011  *RADIOLOGY REPORT*  Clinical Data: Cough and fever 1202  CHEST - 2 VIEW  Comparison: None.  Findings: Heart size is normal.  Thoracic aorta contour and hilar contours are normal.  Normal pulmonary vascularity.  The trachea is midline. The lungs are clear.  There is a slight convex right scoliosis of the upper thoracic spine and slight convex left scoliosis of the lumbar lower thoracic spine.  IMPRESSION:  1.  No acute cardiopulmonary disease. 2.  Slight scoliosis.  Original Report Authenticated By: Britta Mccreedy, M.D.     MDM  Pt seen and examined. Pt speaking in full sentences but has poor air movement. Pt given albuterol/atrovent.Will do CXR because of history of fever, along with coughing and the chest tightness, and rule out pneumonia. Checking UA to also rule out UTI/pyelonephritis, as patient is reporting back pain, although this is most likely from coughing. No CVA tenderness. Discussed importance of quitting smoking w/.the patient. Will re-evaluate.   Reevaluation, patient states she feels much better. Improved air movement. No wheezing. Further history, patient states back pain is upper mid thoracic back pain. Has chest wall tenderness there. Because patient is a smoker, will treat her with antibiotics. Udip  noted. No suprapubic or CVA tenderness. Doubt that her symptoms are from UTI. Discussed imaging, labs with patient. Will refer her to a primary care physician for ongoing management. Patient agrees with plan.  Luiz Blare, MD 07/19/11 1225

## 2011-07-19 NOTE — Discharge Instructions (Signed)
He need to drink lots of extra fluids such as water, Gatorade, and other electrolyte-containing beverages. Your dizziness is most likely from dehydration. Take 2 puffs of of your albuterol inhaler every 4-6 hours. You may decrease the frequency of this, as you start feeling better. too much albuterol can make you feel lightheaded, UR heart race, dizzy. Make sure you finish all the antibiotics, even if you feel better. You need to followup with a primary care physician of your choice. Return for persistent fever above 100.4, if you get worse.

## 2012-05-04 ENCOUNTER — Inpatient Hospital Stay (HOSPITAL_COMMUNITY)
Admission: AD | Admit: 2012-05-04 | Discharge: 2012-05-04 | Disposition: A | Discharge status: Home / Self Care | Payer: BC Managed Care – PPO | Source: Ambulatory Visit | Attending: Obstetrics and Gynecology | Admitting: Obstetrics and Gynecology

## 2012-05-04 ENCOUNTER — Inpatient Hospital Stay (HOSPITAL_COMMUNITY): Payer: BC Managed Care – PPO

## 2012-05-04 ENCOUNTER — Encounter (HOSPITAL_COMMUNITY): Payer: Self-pay

## 2012-05-04 DIAGNOSIS — O4690 Antepartum hemorrhage, unspecified, unspecified trimester: Secondary | ICD-10-CM

## 2012-05-04 DIAGNOSIS — O209 Hemorrhage in early pregnancy, unspecified: Secondary | ICD-10-CM | POA: Insufficient documentation

## 2012-05-04 DIAGNOSIS — O283 Abnormal ultrasonic finding on antenatal screening of mother: Secondary | ICD-10-CM

## 2012-05-04 NOTE — MAU Note (Signed)
Patient states she passed two stringy clots last night with another small clot. No active bleeding at this but has a light spotting. No pain.

## 2012-05-04 NOTE — MAU Provider Note (Signed)
Chief Complaint: Vaginal Bleeding  First Provider Initiated Contact with Patient 05/04/12 1553     SUBJECTIVE HPI: Brenda Bowen is a 42 y.o. G2P0010 at [redacted]w[redacted]d by LMP who presents with small amoutn of vaginal bleeding x 3 days, slightly heavier yesterday, passing small, stringy clots, lighter today. Denies abd pain or passage of tissue. Live IUP verified by office [place] per Pt. Has pictures. Blood type A pos.    Past Medical History  Diagnosis Date  . Fibroid   . Headache   . Hypertension   . Infertility management        OB History   Grav Para Term Preterm Abortions TAB SAB Ect Mult Living   2    1 1     0     # Outc Date GA Lbr Len/2nd Wgt Sex Del Anes PTL Lv   1 TAB            2 CUR              Past Surgical History  Procedure Laterality Date  . Tooth extraction    . Uterine fibroid surgery     History   Social History  . Marital Status: Married    Spouse Name: N/A    Number of Children: N/A  . Years of Education: N/A   Occupational History  . Not on file.   Social History Main Topics  . Smoking status: Current Some Day Smoker -- 0.50 packs/day    Types: Cigarettes  . Smokeless tobacco: Not on file  . Alcohol Use: Yes     Comment: ocas. not since pregnancy  . Drug Use: No  . Sexually Active: Not on file   Other Topics Concern  . Not on file   Social History Narrative  . No narrative on file   No current facility-administered medications on file prior to encounter.   No current outpatient prescriptions on file prior to encounter.   No Known Allergies  ROS: Pertinent items in HPI  OBJECTIVE Blood pressure 129/86, pulse 120, temperature 99.1 F (37.3 C), temperature source Oral, resp. rate 16, height 5' 3.5" (1.613 m), weight 83.734 kg (184 lb 9.6 oz), last menstrual period 06/26/2011, SpO2 99.00%. GENERAL: Well-developed, well-nourished female in no acute distress.  HEENT: Normocephalic HEART: normal rate RESP: normal effort ABDOMEN: Soft,  non-tender EXTREMITIES: Nontender, no edema NEURO: Alert and oriented SPECULUM EXAM: NEFG, small amount of blood-tinged discharge noted, normal odor. cervix clean BIMANUAL: cervix closed; uterus 8 week size, no adnexal tenderness or masses  LAB RESULTS NA  IMAGING US Ob Comp Less 14 Wks  US Ob Transvaginal  05/04/2012  *RADIOLOGY REPORT*  Clinical Data: Vaginal bleeding, advanced maternal age  OBSTETRIC <14 WK [place] AND TRANSVAGINAL OB US  Technique:  Both transabdominal and transvaginal ultrasound examinations were performed for complete evaluation of the gestation as well as the maternal uterus, adnexal regions, and pelvic cul-de-sac.  Transvaginal technique was performed to assess early pregnancy.  Comparison:  None  Intrauterine gestational sac:  Visualized, though abnormal in appearance there is a gestational sac is elongated with echogenic debris extending about its caudal aspect towards the cervix (images 60-65). Yolk sac: Not visualized Embryo: Visualized Cardiac Activity: Visualized, though difficult to trace. Heart Rate: 115 bpm  CRL: 19.4  mm  9 w  4 d             [place] EDC: 12/10/2012  Maternal uterus/adnexae: The right ovary is normal in size measuring 3.2 x  1.5 x 1.8 cm with several peripheral follicles.  No discrete right-sided adnexal mass.  The left ovary is normal in size measuring 2.7 x 1.5 x 1.9 cm with several peripheral follicles.  No discrete left-sided adnexal mass.  No free fluid is seen within the pelvis.  IMPRESSION:  Abnormal appearance of the gestational sac and while difficult to trace, cardiac activity is identified within the fetus.  Crown rump length compatible with estimated delivery date of 12/10/2012. Correlation with serial Beta HCG and follow-up pelvic ultrasound may be performed as clinically indicated.   Original Report Authenticated By: Tacey Ruiz, MD     MAU COURSE Pt informed of abnormal [place] of unknown significance. Will F/U w/ [place] in office.  ASSESSMENT 1.  Vaginal bleeding in pregnancy, first trimester   2. Abnormal fetal ultrasound    PLAN Discharge home per consult w/ Dr. Jackelyn Knife. Bleeding precautions. Pelvic rest x 1 week.  Follow-up Information   Call MEISINGER,TODD D, MD. (tomorrow after 10:00 to discuss follow-up appointment)    Contact information:   [address] [place] [address] [phone]       Follow up with [facility]  [facility]ERN[facility] ADMISS[facility]. (As needed if symptoms worsen)    Contact information:   [address] [place] [address] [phone]       Medication List    STOP taking these medications       albuterol 108 (90 BASE) MCG/ACT inhaler  Commonly known as:  PROVENTIL HFA;VENTOLIN HFA     guaiFENesin 100 MG/5ML liquid  Commonly known as:  ROBITUSSIN     ibuprofen 200 MG tablet  Commonly known as:  ADVIL,MOTRIN      TAKE these medications       prenatal multivitamin Tabs  Take 1 tablet by mouth daily.       [place], [place] 05/04/2012  6:13 PM

## 2012-05-09 ENCOUNTER — Inpatient Hospital Stay (HOSPITAL_COMMUNITY): Payer: BC Managed Care – PPO

## 2012-05-09 ENCOUNTER — Encounter (HOSPITAL_COMMUNITY): Payer: Self-pay | Admitting: *Deleted

## 2012-05-09 ENCOUNTER — Inpatient Hospital Stay (HOSPITAL_COMMUNITY)
Admission: AD | Admit: 2012-05-09 | Discharge: 2012-05-09 | Disposition: A | Discharge status: Home / Self Care | Payer: BC Managed Care – PPO | Source: Ambulatory Visit | Attending: Obstetrics and Gynecology | Admitting: Obstetrics and Gynecology

## 2012-05-09 DIAGNOSIS — O034 Incomplete spontaneous abortion without complication: Secondary | ICD-10-CM

## 2012-05-09 DIAGNOSIS — O039 Complete or unspecified spontaneous abortion without complication: Secondary | ICD-10-CM | POA: Insufficient documentation

## 2012-05-09 LAB — CBC
Hemoglobin: 13.4 g/dL (ref 12.0–15.0)
Platelets: 341 10*3/uL (ref 150–400)
RBC: 4.36 MIL/uL (ref 3.87–5.11)
WBC: 12.9 10*3/uL — ABNORMAL HIGH (ref 4.0–10.5)

## 2012-05-09 MED ORDER — IBUPROFEN 600 MG PO TABS: 600.0000 mg | ORAL_TABLET | Freq: Four times a day (QID) | ORAL | Status: DC | PRN

## 2012-05-09 MED ORDER — OXYTOCIN 40 UNITS IN LACTATED RINGERS INFUSION - SIMPLE MED
250.0000 mL/h | Freq: Once | INTRAVENOUS | Status: AC
  Administered 2012-05-09: 250 mL/h via INTRAVENOUS
  Filled 2012-05-09: qty 1000

## 2012-05-09 MED ORDER — LACTATED RINGERS IV BOLUS (SEPSIS)
1000.0000 mL | Freq: Once | INTRAVENOUS | Status: AC
  Administered 2012-05-09: 1000 mL via INTRAVENOUS

## 2012-05-09 NOTE — MAU Note (Signed)
Pt assisted to undress, pad on - saturated.  Large peri pad applied.

## 2012-05-09 NOTE — MAU Note (Signed)
Pt brought to room by [facility], became dizzy, felt like she was going to pass out at registration desk.  Pt states she was here last week for bleeding, began bleeding very heavily around 2 or 3 a.m.  Passing clots, lower abd cramping.

## 2012-05-09 NOTE — MAU Provider Note (Signed)
History     CSN: [id number]  Arrival date and time: 05/09/12 5621   First Provider Initiated Contact with Patient 05/09/12 [phone]      Chief Complaint  Patient presents with  . Vaginal Bleeding   HPI  Brenda Bowen is a 42 y.o. G2P0010 at [redacted]w[redacted]d who presents today with heavy vaginal bleeding. She states that around 3-4 this morning she started bleeding, and has been changing her pad every hour, but it has not been fully saturated every hour. She has also been passing golf ball sized clots off and on as well. She has been having some cramping that she rate 4/10. She had an ultrasound on 2/13 that showed an IUP with cardiac activity, although the gestational sac was abnormal in appearance and was elongated with some debris within the sac. She has been seen by Dr. Jackelyn Bowen, and he was planning to do a repeat ultrasound on the Thursday.   Past Medical History  Diagnosis Date  . Fibroid   . Headache   . Hypertension   . Infertility management     Past Surgical History  Procedure Laterality Date  . Tooth extraction    . Uterine fibroid surgery      Family History  Problem Relation Age of Onset  . Hypertension Father   . Hypertension Paternal Grandmother   . Cancer Paternal Grandmother     History  Substance Use Topics  . Smoking status: Current Some Day Smoker -- 0.50 packs/day    Types: Cigarettes  . Smokeless tobacco: Not on file  . Alcohol Use: Yes     Comment: ocas. not since pregnancy    Allergies: No Known Allergies  Prescriptions prior to admission  Medication Sig Dispense Refill  . Prenatal Vit-Fe Fumarate-FA (PRENATAL MULTIVITAMIN) TABS Take 1 tablet by mouth daily.        Review of Systems  Constitutional: Negative for fever.  Eyes: Negative for blurred vision.  Gastrointestinal: Positive for abdominal pain. Negative for nausea, vomiting, diarrhea and constipation.  Genitourinary: Negative for dysuria, urgency and frequency.  Neurological: Positive for  dizziness. Negative for headaches.   Physical Exam   Blood pressure 111/57, pulse 71, temperature 97.9 F (36.6 C), temperature source Oral, resp. rate 20, last menstrual period 06/26/2011.  Physical Exam  Nursing note and vitals reviewed. Constitutional: She is oriented to person, place, and time. She appears well-developed and well-nourished. No distress.  Cardiovascular: Normal rate.   Respiratory: Effort normal.  GI: Soft.  Neurological: She is alert and oriented to person, place, and time.  Skin: Skin is warm and dry.  Psychiatric: She has a normal mood and affect.    MAU Course  Procedures  0745: Bedside ultrasound started. LR bolus started. CBC pending.  0800: Care turned over to Brenda Bowen, CNM  Assessment and Plan    Brenda Bowen 05/09/2012, 7:34 AM   US showed no gestational sac or embryo. There is heterogenous fluid in endometrium.  Results for orders placed during the hospital encounter of 05/09/12 (from the past 24 hour(s))  CBC     Status: Abnormal   Collection Time    05/09/12  7:26 AM      Result Value Range   WBC 12.9 (*) 4.0 - 10.5 K/uL   RBC 4.36  3.87 - 5.11 MIL/uL   Hemoglobin 13.4  12.0 - 15.0 g/dL   HCT 57.8  46.9 - 62.9 %   MCV 90.1  78.0 - 100.0 fL   MCH 30.7  26.0 - 34.0 pg   MCHC 34.1  30.0 - 36.0 g/dL   RDW 09.8  11.9 - 14.7 %   Platelets 341  150 - 400 K/uL   Filed Vitals:   05/09/12 0708 05/09/12 0838 05/09/12 0839 05/09/12 0841  BP: 111/57 131/62 136/74 140/74  Pulse: 71 84 81 87  Temp: 97.9 F (36.6 C)     TempSrc: Oral     Resp: 20 18      Discussed with Dr Brenda Bowen.  He wants to give her Pitocin IV infusion to help expel uterine blood. Will observe 1-2 hours to make sure she doesn't need a D&C.

## 2012-09-08 ENCOUNTER — Emergency Department (HOSPITAL_COMMUNITY)
Admission: EM | Admit: 2012-09-08 | Discharge: 2012-09-08 | Disposition: A | Discharge status: Home / Self Care | Payer: BC Managed Care – PPO | Source: Home / Self Care

## 2012-09-08 ENCOUNTER — Encounter (HOSPITAL_COMMUNITY): Payer: Self-pay | Admitting: Emergency Medicine

## 2012-09-08 DIAGNOSIS — R0789 Other chest pain: Secondary | ICD-10-CM

## 2012-09-08 DIAGNOSIS — R071 Chest pain on breathing: Secondary | ICD-10-CM

## 2012-09-08 MED ORDER — DICLOFENAC POTASSIUM 50 MG PO TABS: 50.0000 mg | ORAL_TABLET | Freq: Three times a day (TID) | ORAL | Status: DC

## 2012-09-08 NOTE — ED Provider Notes (Signed)
History     CSN: [id number]  Arrival date & time 09/08/12  1003   None     Chief Complaint  Patient presents with  . Chest Pain    (Consider location/radiation/quality/duration/timing/severity/associated sxs/prior treatment) Patient is a 42 y.o. female presenting with chest pain. The history is provided by the patient.  Chest Pain Pain location:  L chest Pain quality: sharp and stabbing   Pain radiates to:  Does not radiate Pain radiates to the back: no   Pain severity:  Mild Onset quality:  Sudden Duration:  2 days Progression:  Waxing and waning Chronicity:  New Context: movement and raising an arm   Associated symptoms: no cough and no shortness of breath     Past Medical History  Diagnosis Date  . Fibroid   . Headache(784.0)   . Hypertension   . Infertility management     Past Surgical History  Procedure Laterality Date  . Tooth extraction    . Uterine fibroid surgery      Family History  Problem Relation Age of Onset  . Hypertension Father   . Hypertension Paternal Grandmother   . Cancer Paternal Grandmother     History  Substance Use Topics  . Smoking status: Current Some Day Smoker -- 1.00 packs/day    Types: Cigarettes  . Smokeless tobacco: Not on file  . Alcohol Use: Yes     Comment: ocas. not since pregnancy    OB History   Grav Para Term Preterm Abortions TAB SAB Ect Mult Living   2    1 1     0      Review of Systems  Constitutional: Negative.   Respiratory: Negative for cough, chest tightness and shortness of breath.   Cardiovascular: Positive for chest pain.    Allergies  Review of patient's allergies indicates no known allergies.  Home Medications   Current Outpatient Rx  Name  Route  Sig  Dispense  Refill  . diclofenac (CATAFLAM) 50 MG tablet   Oral   Take 1 tablet (50 mg total) by mouth 3 (three) times daily.   30 tablet   0   . ibuprofen (ADVIL,MOTRIN) 600 MG tablet   Oral   Take 1 tablet (600 mg total) by mouth  every 6 (six) hours as needed for pain.   30 tablet   0   . Prenatal Vit-Fe Fumarate-FA (PRENATAL MULTIVITAMIN) TABS   Oral   Take 1 tablet by mouth daily.           BP 141/91  Pulse 67  Temp(Src) 99.1 F (37.3 C)  Resp 14  SpO2 98%  LMP 09/01/2012  Breastfeeding? Unknown  Physical Exam  Nursing note and vitals reviewed. Constitutional: She is oriented to person, place, and time. She appears well-developed and well-nourished.  Neck: Normal range of motion. Neck supple.  Pulmonary/Chest: Effort normal and breath sounds normal. No respiratory distress. She has no wheezes. She exhibits tenderness.  Neurological: She is alert and oriented to person, place, and time.  Skin: Skin is warm and dry.    ED Course  Procedures (including critical care time)  Labs Reviewed - No data to display No results found.   1. Left-sided chest wall pain       MDM         Linna Hoff, MD 09/08/12 1054

## 2012-09-08 NOTE — ED Notes (Signed)
Pt c/o chest pain onset Tuesday... Pain increases w/deep breaths and when she lays down... Denies: SOB, blurry vision, edema, inj/trauma, strenuous activity... She is alert and oriented w/no signs of acute distress.

## 2012-11-06 ENCOUNTER — Telehealth: Payer: Self-pay | Admitting: Oncology

## 2012-11-06 NOTE — Telephone Encounter (Signed)
S/W PT IN RE TO NP APPT 09/17 @ 3 W/DR. GRANFORTUNA REFERRING DR. S COUSINS DX- RECURRENT PREGNANCY LOSS WELCOME PACKET MAILED.

## 2012-11-06 NOTE — Telephone Encounter (Signed)
LVOM FOR PT TO RETURN CALL IN RE TO REFERRAL.  °

## 2012-11-07 ENCOUNTER — Telehealth: Payer: Self-pay | Admitting: Oncology

## 2012-11-07 NOTE — Telephone Encounter (Signed)
C/D 11/07/12 for appt. 12/06/12

## 2012-11-11 ENCOUNTER — Encounter: Payer: Self-pay | Admitting: Oncology

## 2012-11-11 ENCOUNTER — Other Ambulatory Visit: Payer: Self-pay | Admitting: Oncology

## 2012-11-11 DIAGNOSIS — N96 Recurrent pregnancy loss: Secondary | ICD-10-CM

## 2012-11-11 DIAGNOSIS — R760 Raised antibody titer: Secondary | ICD-10-CM

## 2012-11-11 HISTORY — DX: Raised antibody titer: R76.0

## 2012-11-11 HISTORY — DX: Recurrent pregnancy loss: N96

## 2012-11-12 ENCOUNTER — Telehealth: Payer: Self-pay | Admitting: Oncology

## 2012-11-12 NOTE — Telephone Encounter (Signed)
Message to Tiffany re new pt lbs per 8/22 pof. °

## 2012-11-13 ENCOUNTER — Telehealth: Payer: Self-pay | Admitting: Oncology

## 2012-11-13 NOTE — Telephone Encounter (Signed)
LVOM FOR PT TO CALL BACK TO SCHEDULE LAB PRIOR TO VISIT.

## 2012-11-13 NOTE — Telephone Encounter (Signed)
PT CALLED TO SCHEDULE LAB APPT FOR 09/10 @ 8:45. PT CONFIRMED.

## 2012-11-24 ENCOUNTER — Telehealth: Payer: Self-pay | Admitting: Oncology

## 2012-11-24 NOTE — Telephone Encounter (Signed)
Pt called and r/s labs to afternoon

## 2012-11-29 ENCOUNTER — Other Ambulatory Visit (HOSPITAL_BASED_OUTPATIENT_CLINIC_OR_DEPARTMENT_OTHER): Payer: BC Managed Care – PPO | Admitting: Lab

## 2012-11-29 ENCOUNTER — Other Ambulatory Visit: Payer: BC Managed Care – PPO | Admitting: Lab

## 2012-11-29 DIAGNOSIS — R894 Abnormal immunological findings in specimens from other organs, systems and tissues: Secondary | ICD-10-CM

## 2012-11-29 DIAGNOSIS — N96 Recurrent pregnancy loss: Secondary | ICD-10-CM

## 2012-11-29 DIAGNOSIS — R760 Raised antibody titer: Secondary | ICD-10-CM

## 2012-11-29 LAB — CBC & DIFF AND RETIC
Basophils Absolute: 0 10*3/uL (ref 0.0–0.1)
Eosinophils Absolute: 0.3 10*3/uL (ref 0.0–0.5)
HCT: 39.4 % (ref 34.8–46.6)
HGB: 13.4 g/dL (ref 11.6–15.9)
Immature Retic Fract: 7.9 % (ref 1.60–10.00)
LYMPH%: 29.7 % (ref 14.0–49.7)
MONO#: 0.5 10*3/uL (ref 0.1–0.9)
NEUT%: 62.4 % (ref 38.4–76.8)
Platelets: 390 10*3/uL (ref 145–400)
WBC: 10.3 10*3/uL (ref 3.9–10.3)
lymph#: 3.1 10*3/uL (ref 0.9–3.3)

## 2012-11-29 LAB — MORPHOLOGY
PLT EST: ADEQUATE
RBC Comments: NORMAL

## 2012-11-30 LAB — SEDIMENTATION RATE: Sed Rate: 11 mm/hr (ref 0–22)

## 2012-11-30 LAB — CARDIOLIPIN ANTIBODIES, IGG, IGM, IGA: Anticardiolipin IgG: 0 GPL U/mL (ref <23)

## 2012-12-04 ENCOUNTER — Other Ambulatory Visit (HOSPITAL_COMMUNITY): Payer: Self-pay | Admitting: Obstetrics and Gynecology

## 2012-12-04 DIAGNOSIS — N96 Recurrent pregnancy loss: Secondary | ICD-10-CM

## 2012-12-06 ENCOUNTER — Ambulatory Visit (HOSPITAL_BASED_OUTPATIENT_CLINIC_OR_DEPARTMENT_OTHER): Payer: BC Managed Care – PPO | Admitting: Oncology

## 2012-12-06 ENCOUNTER — Ambulatory Visit: Payer: BC Managed Care – PPO

## 2012-12-06 ENCOUNTER — Encounter: Payer: Self-pay | Admitting: Oncology

## 2012-12-06 VITALS — BP 128/79 | HR 81 | Temp 99.0°F | Resp 20 | Ht 63.5 in | Wt 183.5 lb

## 2012-12-06 DIAGNOSIS — E282 Polycystic ovarian syndrome: Secondary | ICD-10-CM

## 2012-12-06 DIAGNOSIS — R760 Raised antibody titer: Secondary | ICD-10-CM

## 2012-12-06 DIAGNOSIS — N96 Recurrent pregnancy loss: Secondary | ICD-10-CM

## 2012-12-06 NOTE — Progress Notes (Signed)
Checked in new patient with no financial issues. Mail and phone only for communication. °

## 2012-12-06 NOTE — Progress Notes (Signed)
New Patient Hematology-Oncology Evaluation   Brenda Bowen [id number] October 30, 1970 42 y.o. 12/06/2012  CC: Dr. Maxie Better   Reason for referral: Recurrent late first trimester pregnancy loss/transient elevation of anticardiolipin IgM antibody   HPI:  Brenda Bowen 42 year old woman in overall good health. She was first pregnant at age 55 and had an elective abortion. She did not get pregnant again until December 2012. She had a miscarriage at about 2 months. She is pregnant again in February 2014. She had spotting for the entire pregnancy and had a miscarriage at about 3 months. Chromosomal studies were normal. In the course of her evaluation she was found to have the polycystic ovary syndrome. She had very irregular periods as a younger woman and periods did not become regular until about 4 years ago. She has noted increase in facial hair and acne which is not present in her 2 sisters. Neither her mother who had 3 children or her sisters have had any problems conceiving. Her one sister had her last child at age 66.  She has no signs or symptoms of a collagen vascular disease.  There is no personal or family history of clotting.  A hypercoagulation screen was done in view of the recurrent pregnancy loss. On 09/25/2012 protein C, protein S, and antithrombin all normal. She tested negative for the presence of the factor V Leiden and prothrombin gene mutations. Although she had a low positive elevation of IgM anticardiolipin antibody of 25 units with reference range for  low-medium positive greater than 20-80, there was no antibody elevation in the IgG class for either the IgG or IgM class of beta-2 glycoprotein 1 antibodies. A lupus anticoagulant was not detected. 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  I repeated anticardiolipin antibodies in anticipation of today's visit done on 12/01/2012. IgM titer is now at the upper limit of normal at 11 units. ANA is negative. ESR is 11  mm. CBC shows a mild elevation of her white blood count 10,300 with a comparison value 12,900  7 months ago in February and this is a typical association with the polycystic ovary syndrome.  She was recently started on a trial of metformin.   PMH: Past Medical History  Diagnosis Date  . Fibroid   . Headache(784.0)   . Hypertension   . Infertility management   . 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 Tooth extraction    . Uterine fibroid surgery      Allergies: No Known Allergies  Medications:Metformin 500 mg by mouth 3 times a day, ibuprofen when necessary, Lotrisone cream and clindamycin lotion to facial acne when necessary, prenatal vitamins with iron.    Social History: Married to the same man for the last 11 years. 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  reports that she has been smoking Cigarettes.  She has been smoking about 1.00 pack per day. She reports that  drinks alcohol.   Family History: Family History  Problem Relation Age of Onset  . Hypertension Father   . Hypertension Paternal Grandmother   . Cancer Paternal Grandmother     Review of Systems: Constitutional symptoms:No constitutional symptoms  HEENT:No sore throat no  Respiratory: No cough or dyspnea  Cardiovascular:  No chest pain or palpitations  Gastrointestinal ROS: No abdominal pain or change in bowel habit  Genito-Urinary ROS: She gets frequent menstrual cramps  Hematological and Lymphatic: Musculoskeletal:No muscle bone or joint pain  Neurologic:No headache or change in vision  Dermatologic:Chronic acne and increased facial hair growth  Remaining ROS negative.  Physical Exam: Blood pressure 128/79, pulse 81, temperature 99 F (37.2 C), temperature source Oral,  resp. rate 20, height 5' 3.5" (1.613 m), weight 183 lb 8 oz (83.235 kg), last menstrual period 06/26/2011, unknown if currently breastfeeding. Wt Readings from Last 3 Encounters:  12/06/12 183 lb 8 oz (83.235 kg)  05/04/12 184 lb 9.6 oz (83.734 kg)  02/05/11 182 lb (82.555 kg)    General appearance:Brenda Bowen well-nourished African American woman  HENNT:Pharynx no erythema or exudate. No mass. No thyromegaly or thyroid nodules.  Lymph nodes:No lymphadenopathy  Breasts: Lungs:Clear to auscultation resonant to percussion  Heart:Regular rhythm no murmur gallop or click  Vascular:No carotid bruits, no cyanosis  Abdominal:Soft, nontender, no mass, no organomegaly  GU: Extremities:No edema, no calf tenderness  Neurologic:Mental status intact, cranial nerves grossly normal, motor strength 5 over 5, reflexes 1+ symmetric, sensation intact to vibration over the fingertips  Skin:No rash or ecchymosis. Facial acne.    Lab Results: Lab Results  Component Value Date   WBC 10.3 11/29/2012   HGB 13.4 11/29/2012   HCT 39.4 11/29/2012   MCV 89.5 11/29/2012   PLT 390 11/29/2012     Chemistry   No results found for this basename: NA, K, CL, CO2, BUN, CREATININE, GLU   No results found for this basename: CALCIUM, ALKPHOS, AST, ALT, BILITOT        Impression and Plan: Second trimester pregnancy loss x29 in a 42 year old otherwise healthy lady with recently diagnosed polycystic ovary syndrome  She had a transient, mild, elevation of IgM anticardiolipin antibody which was not reproducible on repeat testing. Negative antibodies to beta-2 glycoprotein 1 and a negative lupus-type anticoagulant. There is no evidence that she has antiphospholipid antibody syndrome.  She is a heterozygote for the MHTFR gene which has not been validated as a cause for recurrent pregnancy loss.  It seems likely that her pregnancy loss is related to her polycystic ovary syndrome and her advanced age.  I would not recommend  anticoagulation during attempts to get pregnant or during a pregnancy if it occurs again.      Levert Feinstein, MD 12/06/2012, 5:48 PM

## 2012-12-11 ENCOUNTER — Ambulatory Visit (HOSPITAL_COMMUNITY)
Admission: RE | Admit: 2012-12-11 | Discharge: 2012-12-11 | Disposition: A | Discharge status: Home / Self Care | Payer: BC Managed Care – PPO | Source: Ambulatory Visit | Attending: Obstetrics and Gynecology | Admitting: Obstetrics and Gynecology

## 2012-12-11 DIAGNOSIS — N96 Recurrent pregnancy loss: Secondary | ICD-10-CM

## 2012-12-11 MED ORDER — IOHEXOL 300 MG/ML  SOLN
20.0000 mL | Freq: Once | INTRAMUSCULAR | Status: AC | PRN
  Administered 2012-12-11: 17 mL

## 2012-12-25 ENCOUNTER — Encounter (HOSPITAL_COMMUNITY): Payer: Self-pay

## 2012-12-25 ENCOUNTER — Encounter (HOSPITAL_COMMUNITY)
Admission: RE | Admit: 2012-12-25 | Discharge: 2012-12-25 | Disposition: A | Discharge status: Home / Self Care | Payer: BC Managed Care – PPO | Source: Ambulatory Visit | Attending: Obstetrics and Gynecology | Admitting: Obstetrics and Gynecology

## 2012-12-25 LAB — BASIC METABOLIC PANEL
BUN: 14 mg/dL (ref 6–23)
Calcium: 9.7 mg/dL (ref 8.4–10.5)
GFR calc Af Amer: 74 mL/min — ABNORMAL LOW (ref >90)
GFR calc non Af Amer: 64 mL/min — ABNORMAL LOW (ref >90)
Glucose, Bld: 85 mg/dL (ref 70–99)
Potassium: 4.2 mEq/L (ref 3.5–5.1)
Sodium: 137 mEq/L (ref 135–145)

## 2012-12-25 LAB — CBC
HCT: 40.9 % (ref 36.0–46.0)
Hemoglobin: 13.9 g/dL (ref 12.0–15.0)
MCH: 30.4 pg (ref 26.0–34.0)
MCHC: 34 g/dL (ref 30.0–36.0)
RBC: 4.57 MIL/uL (ref 3.87–5.11)
RDW: 13.8 % (ref 11.5–15.5)

## 2012-12-25 LAB — GLUCOSE, CAPILLARY: Glucose-Capillary: 92 mg/dL (ref 70–99)

## 2012-12-25 NOTE — Patient Instructions (Addendum)
Your procedure is scheduled on: 12/27/2012  Enter through the Main Entrance of [facility] at: 1215PM  Pick up the phone at the desk and dial [phone].  Call this number if you have problems the morning of surgery: [phone].  Remember: Do NOT eat food: AFTER MIDNIGHT 12/27/2015 Do NOT drink clear liquids after: AFTER 0930AM 12/27/2012 Take these medicines the morning of surgery with a SIP OF WATER: N/A, TAKE 1/2 DOSE OF GLUCOPHAGE THE NIGHT BEFORE SURGERY  Do NOT wear jewelry, make-up, or nail polish. Do NOT wear lotions, powders, or perfumes.  You may wear deoderant. Do NOT shave for 48 hours prior to surgery. Do NOT bring valuables to the hospital. Contacts, dentures, or bridgework may not be worn into surgery. Arrange to have someone drive you home after surgery and stay with you for the first 24 hours after your procedure

## 2012-12-26 NOTE — H&P (Signed)
Chief complaint: Recurrent miscarriage with HSG evidence of stage I Asherman's syndrome, status post hysteroscopic resection of submucosal myoma in 2008. HISTORY OF PRESENT ILLNESS:  The reason for the consultation was HSG evidence of Asherman syndrome with recurrent miscarriage and desire to conceive, in view of her advanced reproductive age.  She is a 42 year old African American female gravida 3 para 0030.  She had a hysteroscopic resection of submucosal myoma in 2008. Age 37: Elective abortion at 8 weeks no complications. 2012: Spontaneous abortion at 8 weeks.  No D&C In February 2014 spontaneous abortion at 12 weeks after embryonic heart activity was dissected.  No D&C. The last 2 pregnancies were from her present partner.  No infertility was involved.  The patient had a full workup for recurrent miscarriage.  She had normal chromosome analysis.  Chromosome analysis on the husband was not done because he does not have insurance yet.  She had anti-cardiolipin IgM antibody positive at 25 units per milliliter on one occasion, but it was negative when the rheumatologist repeated later.  MTHFR mutation analysis showed only one copy of C677T. Her AMH was 4.6 ng/mL in 2010.  OBSTETRIC HISTORY: See history of present illness for one elective abortion and 2 spontaneous miscarriages GYNECOLOGIC HISTORY: Menarche at age 47, periods every 20-30 days, lasting for 2-3 days.  LMP 12/03/2012.  She has no history of STDs.  No history of IUD use.  She has dysmenorrhea but no dyspareunia.  Last Pap smear was June of 2014.  She had a mammogram in 2013.  She had hysteroscopic resection of submucous myoma in 2008. 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  She is married.  She lives with her husband.  She smokes at least 8 cigarettes per day for 10 years (since she was  advised to quit smoking and prescribed bupropion but has not been taking it yet.)  No other substance abuse.  She doesn't exercise regularly.   REVIEW OF SYSTEMS: All 12 systems were reviewed.  Please see the attached questionnaire.  It is pertinent for acne.   FAMILY HISTORY: No hereditary diseases or mental retardation.  No cancer of ovary, uterus, or colon.  Her grandmother had breast cancer.  PHYSICAL EXAMINATION: Well developed, well nourished [place] American female in no acute distress.   Height: 5 feet 2 inches Weight: 181 pounds blood pressure: 140/98 mmHg Pulse: 88 bpm HEENT: normal Neck: Supple, no thyromegaly, no lymphadenopathy Lungs: Clear to auscultation Heart: Regular rhythm and rate Abdomen: Soft, nontender, no hepatosplenomegaly Extremities: Negative for cyanosis clubbing and edema Pelvic: Deferred  IMPRESSION: Asherman syndrome, stage I Recurrent pregnancy loss, with otherwise normal workup Desire to conceive Smoking history PLAN: Hysteroscopy, lysis of adhesions, uterine stent placement Enefits, risks, consequences of the procedure were reviewed with the patient.  All her questions were answered. Postoperatively she will likely use high dose estrogen.   Fermin Schwab, MD

## 2012-12-27 ENCOUNTER — Encounter (HOSPITAL_COMMUNITY): Payer: Self-pay | Admitting: *Deleted

## 2012-12-27 ENCOUNTER — Ambulatory Visit (HOSPITAL_COMMUNITY)
Admission: RE | Admit: 2012-12-27 | Discharge: 2012-12-27 | Disposition: A | Discharge status: Home / Self Care | Payer: BC Managed Care – PPO | Source: Ambulatory Visit | Attending: Obstetrics and Gynecology | Admitting: Obstetrics and Gynecology

## 2012-12-27 ENCOUNTER — Encounter (HOSPITAL_COMMUNITY)
Admission: RE | Disposition: A | Discharge status: Home / Self Care | Payer: Self-pay | Source: Ambulatory Visit | Attending: Obstetrics and Gynecology

## 2012-12-27 ENCOUNTER — Ambulatory Visit (HOSPITAL_COMMUNITY): Payer: BC Managed Care – PPO | Admitting: Anesthesiology

## 2012-12-27 ENCOUNTER — Encounter (HOSPITAL_COMMUNITY): Payer: BC Managed Care – PPO | Admitting: Anesthesiology

## 2012-12-27 DIAGNOSIS — N856 Intrauterine synechiae: Secondary | ICD-10-CM | POA: Insufficient documentation

## 2012-12-27 HISTORY — PX: HYSTEROSCOPY: SHX211

## 2012-12-27 SURGERY — HYSTEROSCOPY
Anesthesia: General | Site: Vagina | Wound class: Clean Contaminated

## 2012-12-27 MED ORDER — ESTRADIOL 2 MG PO TABS: 2.0000 mg | ORAL_TABLET | Freq: Two times a day (BID) | ORAL | Status: DC

## 2012-12-27 MED ORDER — PROPOFOL 10 MG/ML IV EMUL
INTRAVENOUS | Status: AC
  Filled 2012-12-27: qty 20

## 2012-12-27 MED ORDER — DEXAMETHASONE SODIUM PHOSPHATE 10 MG/ML IJ SOLN
INTRAMUSCULAR | Status: AC
  Filled 2012-12-27: qty 1

## 2012-12-27 MED ORDER — LIDOCAINE HCL (CARDIAC) 20 MG/ML IV SOLN
INTRAVENOUS | Status: DC | PRN
  Administered 2012-12-27: 30 mg via INTRAVENOUS

## 2012-12-27 MED ORDER — DEXAMETHASONE SODIUM PHOSPHATE 10 MG/ML IJ SOLN
INTRAMUSCULAR | Status: DC | PRN
  Administered 2012-12-27: 10 mg via INTRAVENOUS

## 2012-12-27 MED ORDER — MIDAZOLAM HCL 5 MG/5ML IJ SOLN
INTRAMUSCULAR | Status: DC | PRN
  Administered 2012-12-27: 2 mg via INTRAVENOUS

## 2012-12-27 MED ORDER — CEFAZOLIN SODIUM-DEXTROSE 2-3 GM-% IV SOLR
INTRAVENOUS | Status: AC
  Filled 2012-12-27: qty 50

## 2012-12-27 MED ORDER — SODIUM CHLORIDE 0.9 % IJ SOLN
INTRAMUSCULAR | Status: AC
  Filled 2012-12-27: qty 50

## 2012-12-27 MED ORDER — FENTANYL CITRATE 0.05 MG/ML IJ SOLN
INTRAMUSCULAR | Status: AC
  Filled 2012-12-27: qty 2

## 2012-12-27 MED ORDER — ONDANSETRON HCL 4 MG/2ML IJ SOLN
INTRAMUSCULAR | Status: AC
  Filled 2012-12-27: qty 2

## 2012-12-27 MED ORDER — MIDAZOLAM HCL 2 MG/2ML IJ SOLN
INTRAMUSCULAR | Status: AC
  Filled 2012-12-27: qty 2

## 2012-12-27 MED ORDER — FENTANYL CITRATE 0.05 MG/ML IJ SOLN: 25.0000 ug | INTRAMUSCULAR | Status: DC | PRN

## 2012-12-27 MED ORDER — ONDANSETRON HCL 4 MG/2ML IJ SOLN
INTRAMUSCULAR | Status: DC | PRN
  Administered 2012-12-27: 4 mg via INTRAMUSCULAR

## 2012-12-27 MED ORDER — FENTANYL CITRATE 0.05 MG/ML IJ SOLN
INTRAMUSCULAR | Status: DC | PRN
  Administered 2012-12-27: 100 ug via INTRAVENOUS

## 2012-12-27 MED ORDER — KETOROLAC TROMETHAMINE 30 MG/ML IJ SOLN
INTRAMUSCULAR | Status: DC | PRN
  Administered 2012-12-27: 30 mg via INTRAVENOUS

## 2012-12-27 MED ORDER — LACTATED RINGERS IV SOLN
INTRAVENOUS | Status: DC
  Administered 2012-12-27: 13:00:00 via INTRAVENOUS

## 2012-12-27 MED ORDER — DOXYCYCLINE HYCLATE 50 MG PO CAPS: 100.0000 mg | ORAL_CAPSULE | Freq: Two times a day (BID) | ORAL | Status: DC

## 2012-12-27 MED ORDER — LIDOCAINE HCL (CARDIAC) 20 MG/ML IV SOLN
INTRAVENOUS | Status: AC
  Filled 2012-12-27: qty 5

## 2012-12-27 MED ORDER — VASOPRESSIN 20 UNIT/ML IJ SOLN
INTRAMUSCULAR | Status: AC
  Filled 2012-12-27: qty 1

## 2012-12-27 MED ORDER — CEFAZOLIN SODIUM-DEXTROSE 2-3 GM-% IV SOLR
2.0000 g | INTRAVENOUS | Status: AC
  Administered 2012-12-27: 2 g via INTRAVENOUS

## 2012-12-27 MED ORDER — SODIUM CHLORIDE 0.9 % IR SOLN
Status: DC | PRN
  Administered 2012-12-27: 3000 mL

## 2012-12-27 MED ORDER — VASOPRESSIN 20 UNIT/ML IJ SOLN
INTRAVENOUS | Status: DC | PRN
  Administered 2012-12-27: 15:00:00 via INTRAMUSCULAR

## 2012-12-27 MED ORDER — NAPROXEN SODIUM 550 MG PO TABS: 550.0000 mg | ORAL_TABLET | Freq: Two times a day (BID) | ORAL | Status: DC

## 2012-12-27 MED ORDER — PROPOFOL 10 MG/ML IV BOLUS
INTRAVENOUS | Status: DC | PRN
  Administered 2012-12-27: 200 mg via INTRAVENOUS

## 2012-12-27 SURGICAL SUPPLY — 29 items
CANISTER SUCTION 2500CC (MISCELLANEOUS) ×2 IMPLANT
CANNULA CURETTE W/SYR 6 (CANNULA) IMPLANT
CANNULA CURETTE W/SYR 7 (CANNULA) IMPLANT
CATH ROBINSON RED A/P 16FR (CATHETERS) ×2 IMPLANT
CLOTH BEACON ORANGE TIMEOUT ST (SAFETY) ×2 IMPLANT
CONTAINER PREFILL 10% NBF 60ML (FORM) ×4 IMPLANT
CORD ACTIVE DISPOSABLE (ELECTRODE)
CORD ELECTRO ACTIVE DISP (ELECTRODE) IMPLANT
DRAPE C-ARM 42X72 X-RAY (DRAPES) IMPLANT
DRESSING TELFA 8X3 (GAUZE/BANDAGES/DRESSINGS) ×2 IMPLANT
ELECT LOOP GYNE PRO 24FR (CUTTING LOOP)
ELECT REM PT RETURN 9FT ADLT (ELECTROSURGICAL) ×2
ELECT VAPORTRODE GRVD BAR (ELECTRODE) IMPLANT
ELECTRODE LOOP GYNE PRO 24FR (CUTTING LOOP) IMPLANT
ELECTRODE REM PT RTRN 9FT ADLT (ELECTROSURGICAL) ×1 IMPLANT
ELECTRODE ROLLER VERSAPOINT (ELECTRODE) IMPLANT
ELECTRODE RT ANGLE VERSAPOINT (CUTTING LOOP) IMPLANT
GLOVE BIO SURGEON STRL SZ8 (GLOVE) ×2 IMPLANT
GLOVE BIOGEL PI IND STRL 8.5 (GLOVE) ×1 IMPLANT
GLOVE BIOGEL PI INDICATOR 8.5 (GLOVE) ×1
GOWN STRL REIN XL XLG (GOWN DISPOSABLE) ×4 IMPLANT
LOOP ANGLED CUTTING 22FR (CUTTING LOOP) IMPLANT
PACK HYSTEROSCOPY LF (CUSTOM PROCEDURE TRAY) ×2 IMPLANT
PAD OB MATERNITY 4.3X12.25 (PERSONAL CARE ITEMS) ×2 IMPLANT
STENT BALLN UTERINE 3CM 6FR (Stent) IMPLANT
STENT BALLN UTERINE 4CM 6FR (STENTS) ×2 IMPLANT
SUT SILK 2 0 SH (SUTURE) IMPLANT
TOWEL OR 17X24 6PK STRL BLUE (TOWEL DISPOSABLE) ×4 IMPLANT
WATER STERILE IRR 1000ML POUR (IV SOLUTION) ×2 IMPLANT

## 2012-12-27 NOTE — OR Nursing (Signed)
Placement of balloon uterine stent by Dr. April Manson at [phone]

## 2012-12-27 NOTE — Transfer of Care (Signed)
Immediate Anesthesia Transfer of Care Note  Patient: Brenda Bowen  Procedure(s) Performed: Procedure(s): HYSTEROSCOPY, LYSIS OF ADHESIONS WITH PLACEMENT OF BALLOON UTERINE STENT BALLOON (N/A)  Patient Location: PACU  Anesthesia Type:General  Level of Consciousness: awake, alert  and oriented  Airway & Oxygen Therapy: Patient Spontanous Breathing and Patient connected to nasal cannula oxygen  Post-op Assessment: Report given to PACU RN and Post -op Vital signs reviewed and stable  Post vital signs: Reviewed and stable  Complications: No apparent anesthesia complications

## 2012-12-27 NOTE — Op Note (Signed)
OPERATIVE NOTE  Preoperative diagnosis: Intrauterine adhesions  Postoperative diagnosis: Intrauterine adhesions, stage I  Procedure: Hysteroscopy, lysis of adhesions, intrauterine stent placement  Surgeon: Fermin Schwab  Anesthesia: General  Complications: None  Estimated blood loss: Less than 20 mL  Specimen: Endometrial curettings to pathology  Findings: Endocervical canal appeared normal. Endometrial cavity contained luminal and filmy adhesions, a linear one at midline and two bands to the right of this.  Both tubal ostia were seen. There was a 2 x 1 cm ostial polyp on the left side. The uterus sounded to 10 cm. Description of procedure: Patient was placed in dorsal supine position. General anesthesia was administered. She was placed in lithotomy position. She was prepped and draped in sterile manner. A vaginal speculum was placed. A dilute vasopressin solution containing 0.33 units per milliliter was injected into the cervical stroma x5 cc. A Slimline hysteroscope with 30 lens was inserted into the canal and above findings were noted. Distention medium was normal saline. Distention method was a hysteroscopic pump set at 80 mm mercury. Above findings were noted. Using hysteroscopic scissors, the  luminal and marginal adhesions were taken down.   An 8 ml Cook intrauterine balloon stent was rolled around a tonsil clamp after the cervix had been dilated to 8.5 mm with Hegars, and inserted into the endometrial cavity.  It was allowed to reform its shape by distending and with fluid and then the fluid was taken out.  The stem of the stent was sutured to the cervix with 2-0 silk. Hemostasis was insured. Instrument count was correct. Estimated blood loss was less than 20 mL. The patient tolerated the procedure well and was transferred to recovery in satisfactory condition. She will take doxycycline 100 mg twice a day until the stent is removed in the office in 2 weeks.  She will take 2 mg  estradiol twice daily for 30 days.  Provera 10 mg daily will be added to the last 5 days of this regimen.  She will have a test of cure saline contrast SHG in about 6 weeks.  Fermin Schwab

## 2012-12-27 NOTE — H&P (Signed)
History and Physical Interval Note:  12/27/2012 2:13 PM  Brenda Bowen  has presented today for surgery, with the diagnosis of ASHERMAN'S ADHESIONS  The various methods of treatment have been discussed with the patient and family. After consideration of risks, benefits and other options for treatment, the patient has consented to  Procedure(s): HYSTEROSCOPY, LYSIS OF ADHESIONS WITH STENT PLACEMENT (N/A) as a surgical intervention .  The patient's history has been reviewed, patient examined, no change in status, stable for surgery.  I have reviewed the patient's chart and labs.  Questions were answered to the patient's satisfaction.     Fermin Schwab

## 2012-12-27 NOTE — Anesthesia Postprocedure Evaluation (Signed)
  Anesthesia Post-op Note  Patient: Brenda Bowen  Procedure(s) Performed: Procedure(s): HYSTEROSCOPY, LYSIS OF ADHESIONS WITH PLACEMENT OF BALLOON UTERINE STENT BALLOON (N/A)  Patient Location: PACU  Anesthesia Type:General  Level of Consciousness: awake, alert  and oriented  Airway and Oxygen Therapy: Patient Spontanous Breathing  Post-op Pain: mild  Post-op Assessment: Post-op Vital signs reviewed, Patient's Cardiovascular Status Stable, Respiratory Function Stable, Patent Airway, No signs of Nausea or vomiting and Pain level controlled  Post-op Vital Signs: Reviewed and stable  Complications: No apparent anesthesia complications

## 2012-12-27 NOTE — Anesthesia Preprocedure Evaluation (Signed)
Anesthesia Evaluation  Patient identified by MRN, date of birth, ID band Patient awake    Reviewed: Allergy & Precautions, H&P , Patient's Chart, lab work & pertinent test results, reviewed documented beta blocker date and time   Airway Mallampati: II TM Distance: >3 FB Neck ROM: full    Dental no notable dental hx.    Pulmonary  breath sounds clear to auscultation  Pulmonary exam normal       Cardiovascular Rhythm:regular Rate:Normal     Neuro/Psych    GI/Hepatic   Endo/Other    Renal/GU      Musculoskeletal   Abdominal   Peds  Hematology   Anesthesia Other Findings   Reproductive/Obstetrics                           Anesthesia Physical Anesthesia Plan  ASA: II  Anesthesia Plan:    Post-op Pain Management:    Induction: Intravenous  Airway Management Planned: LMA  Additional Equipment:   Intra-op Plan:   Post-operative Plan:   Informed Consent: I have reviewed the patients History and Physical, chart, labs and discussed the procedure including the risks, benefits and alternatives for the proposed anesthesia with the patient or authorized representative who has indicated his/her understanding and acceptance.   Dental Advisory Given and Dental advisory given  Plan Discussed with: CRNA and Surgeon  Anesthesia Plan Comments:         Anesthesia Quick Evaluation  

## 2012-12-28 ENCOUNTER — Encounter (HOSPITAL_COMMUNITY): Payer: Self-pay | Admitting: Obstetrics and Gynecology

## 2013-01-25 ENCOUNTER — Other Ambulatory Visit: Payer: Self-pay

## 2013-07-26 ENCOUNTER — Encounter (HOSPITAL_COMMUNITY): Payer: Self-pay | Admitting: Emergency Medicine

## 2013-07-26 ENCOUNTER — Emergency Department (HOSPITAL_COMMUNITY)
Admission: EM | Admit: 2013-07-26 | Discharge: 2013-07-26 | Disposition: A | Discharge status: Home / Self Care | Payer: BC Managed Care – PPO | Source: Home / Self Care | Attending: Emergency Medicine | Admitting: Emergency Medicine

## 2013-07-26 DIAGNOSIS — R519 Headache, unspecified: Secondary | ICD-10-CM

## 2013-07-26 DIAGNOSIS — R51 Headache: Secondary | ICD-10-CM

## 2013-07-26 DIAGNOSIS — R109 Unspecified abdominal pain: Secondary | ICD-10-CM

## 2013-07-26 DIAGNOSIS — M549 Dorsalgia, unspecified: Secondary | ICD-10-CM

## 2013-07-26 MED ORDER — CYCLOBENZAPRINE HCL 10 MG PO TABS: 10.0000 mg | ORAL_TABLET | Freq: Three times a day (TID) | ORAL | Status: DC | PRN

## 2013-07-26 MED ORDER — ETODOLAC 500 MG PO TABS: 500.0000 mg | ORAL_TABLET | Freq: Two times a day (BID) | ORAL | Status: DC

## 2013-07-26 NOTE — ED Provider Notes (Signed)
CSN: [id number]     Arrival date & time 07/26/13  1747 History   First MD Initiated Contact with Patient 07/26/13 1905     Chief Complaint  Patient presents with  . Back Pain  . Headache   (Consider location/radiation/quality/duration/timing/severity/associated sxs/prior Treatment) HPI Comments: 43 year old female presents complaining of upper back pain, headache, since Sunday 4 days ago. Her symptoms have waxed and waned since they began. Her pain is across her upper back and is worse with movement. The headache has been in different spots around her head intermittently. She currently rates her headache as 4-5/10 in severity. She denies any blurry vision, lightheadedness, fever, chills, or neck stiffness associated with headache. She denies thunderclap onset of the headache. She also admits to some intermittent sharp abdominal pains mostly occurring after being in the right upper quadrant for the past couple of days. This was most severe after eating a cheeseburger last night. No vomiting or diarrhea, no fever. Also she has some mild generalized body aches.  Patient is a 43 y.o. female presenting with back pain and headaches.  Back Pain Associated symptoms: abdominal pain and headaches   Associated symptoms: no dysuria and no fever   Headache Associated symptoms: abdominal pain, back pain and myalgias   Associated symptoms: no fatigue, no fever, no nausea and no vomiting     Past Medical History  Diagnosis Date  . Fibroid   . Hypertension   . Infertility management   . 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 Tooth extraction    . Uterine fibroid surgery    . Hysteroscopy N/A 12/27/2012    Procedure: HYSTEROSCOPY, LYSIS OF ADHESIONS WITH PLACEMENT OF BALLOON UTERINE STENT 8ML BALLOON;  Surgeon: Governor Specking, MD;  Location: [facility];  Service: Gynecology;  Laterality: N/A;   Family History  Problem Relation Age of Onset  . Hypertension Father   . Hypertension Paternal Grandmother   . Cancer Paternal Grandmother    History  Substance Use Topics  . Smoking status: Current Some Day Smoker -- 1.00 packs/day for 8 years    Types: Cigarettes  . Smokeless tobacco: Never Used  . Alcohol Use: Yes     Comment: ocas.   OB History   Grav Para Term Preterm Abortions TAB SAB Ect Mult Living   2    1 1     0     Review of Systems  Constitutional: Negative for fever, chills and fatigue.  Gastrointestinal: Positive for abdominal pain. Negative for nausea and vomiting.  Genitourinary: Positive for flank pain. Negative for dysuria, urgency and hematuria.  Musculoskeletal: Positive for back pain and myalgias.  Neurological: Positive for headaches.  All other systems reviewed and are negative.   Allergies  Review of patient's allergies indicates no known allergies.  Home Medications   Prior to Admission medications   Medication Sig Start Date End Date Taking? Authorizing Provider  clindamycin (CLEOCIN T) 1 % lotion Apply topically every other day. 10/09/12   Historical Provider, MD  clotrimazole-betamethasone (LOTRISONE) cream Apply topically as needed. 10/09/12   Historical Provider, MD  cyclobenzaprine (FLEXERIL) 10 MG tablet Take 1 tablet (10 mg total) by mouth 3 (three) times daily as needed for muscle spasms. 07/26/13   Liam Graham, PA-C  doxycycline (VIBRAMYCIN) 50 MG capsule Take 2 capsules (100 mg total)  by mouth 2 (two) times daily. 12/27/12   Governor Specking, MD  estradiol (ESTRACE) 2 MG tablet Take 1 tablet (2 mg total) by mouth 2 (two) times daily. 12/27/12   Governor Specking, MD  etodolac (LODINE) 500 MG tablet Take 1 tablet (500 mg total) by mouth 2 (two) times daily. 07/26/13   Liam Graham, PA-C  ibuprofen (ADVIL,MOTRIN) 600 MG tablet Take 1 tablet (600 mg total) by mouth  every 6 (six) hours as needed for pain. 05/09/12   Seabron Spates, CNM  metFORMIN (GLUCOPHAGE) 500 MG tablet Take 500 mg by mouth 3 (three) times daily. 11/06/12   Historical Provider, MD  naproxen sodium (ANAPROX DS) 550 MG tablet Take 1 tablet (550 mg total) by mouth 2 (two) times daily with a meal. 12/27/12   Governor Specking, MD  Prenatal Vit-Fe Fumarate-FA (PRENATAL MULTIVITAMIN) TABS Take 1 tablet by mouth daily.    Historical Provider, MD   BP 138/83  Pulse 76  Temp(Src) 98.6 F (37 C) (Oral)  Resp 16  SpO2 100%  LMP 06/26/2013 Physical Exam  Nursing note and vitals reviewed. Constitutional: She is oriented to person, place, and time. Vital signs are normal. She appears well-developed and well-nourished. No distress.  HENT:  Head: Normocephalic and atraumatic.  Eyes: Conjunctivae are normal. Right eye exhibits no discharge. Left eye exhibits no discharge.  Neck: Normal range of motion. Neck supple.  Cardiovascular: Normal rate, regular rhythm and normal heart sounds.   Pulmonary/Chest: Effort normal and breath sounds normal. No respiratory distress.  Abdominal: Normal appearance and bowel sounds are normal. There is tenderness in the right upper quadrant. There is no rigidity, no rebound, no guarding, no CVA tenderness, no tenderness at McBurney's point and negative Murphy's sign.  Musculoskeletal:       Thoracic back: She exhibits tenderness and bony tenderness. She exhibits no swelling, no deformity and no spasm.  Mild diffuse tenderness about the upper back   Neurological: She is alert and oriented to person, place, and time. She has normal strength. Coordination normal.  Skin: Skin is warm and dry. No rash noted. She is not diaphoretic.  Psychiatric: She has a normal mood and affect. Judgment normal.    ED Course  Procedures (including critical care time) Labs Review Labs Reviewed - No data to display  Imaging Review No results found.   MDM   1. Back pain   2.  Headache   3. Abdominal pain    Probable muscle strain causing back pain and headache, may also have cholelithiasis. Getting an abdominal ultrasound in the morning , ED if worsening.   Meds ordered this encounter  Medications  . etodolac (LODINE) 500 MG tablet    Sig: Take 1 tablet (500 mg total) by mouth 2 (two) times daily.    Dispense:  30 tablet    Refill:  1    Order Specific Question:  Supervising Provider    Answer:  Jake Michaelis, DAVID C [id number]  . cyclobenzaprine (FLEXERIL) 10 MG tablet    Sig: Take 1 tablet (10 mg total) by mouth 3 (three) times daily as needed for muscle spasms.    Dispense:  20 tablet    Refill:  0    Order Specific Question:  Supervising Provider    Answer:  Jake Michaelis, DAVID C [[id number]]       Liam Graham, PA-C 07/26/13 1954

## 2013-07-26 NOTE — Discharge Instructions (Signed)
Someone from the ultrasound scheduling department will call you at about 7:00 AM tomorrow morning to schedule an imaging appointment. Do not eat anything after midnight tonight until after your ultrasound.  Abdominal Pain, Women Abdominal (stomach, pelvic, or belly) pain can be caused by many things. It is important to tell your doctor:  The location of the pain.  Does it come and go or is it present all the time?  Are there things that start the pain (eating certain foods, exercise)?  Are there other symptoms associated with the pain (fever, nausea, vomiting, diarrhea)? All of this is helpful to know when trying to find the cause of the pain. CAUSES   Stomach: virus or bacteria infection, or ulcer.  Intestine: appendicitis (inflamed appendix), regional ileitis (Crohn's disease), ulcerative colitis (inflamed colon), irritable bowel syndrome, diverticulitis (inflamed diverticulum of the colon), or cancer of the stomach or intestine.  Gallbladder disease or stones in the gallbladder.  Kidney disease, kidney stones, or infection.  Pancreas infection or cancer.  Fibromyalgia (pain disorder).  Diseases of the female organs:  Uterus: fibroid (non-cancerous) tumors or infection.  Fallopian tubes: infection or tubal pregnancy.  Ovary: cysts or tumors.  Pelvic adhesions (scar tissue).  Endometriosis (uterus lining tissue growing in the pelvis and on the pelvic organs).  Pelvic congestion syndrome (female organs filling up with blood just before the menstrual period).  Pain with the menstrual period.  Pain with ovulation (producing an egg).  Pain with an IUD (intrauterine device, birth control) in the uterus.  Cancer of the female organs.  Functional pain (pain not caused by a disease, may improve without treatment).  Psychological pain.  Depression. DIAGNOSIS  Your doctor will decide the seriousness of your pain by doing an examination.  Blood  tests.  X-rays.  Ultrasound.  CT scan (computed tomography, special type of X-ray).  MRI (magnetic resonance imaging).  Cultures, for infection.  Barium enema (dye inserted in the large intestine, to better view it with X-rays).  Colonoscopy (looking in intestine with a lighted tube).  Laparoscopy (minor surgery, looking in abdomen with a lighted tube).  Major abdominal exploratory surgery (looking in abdomen with a large incision). TREATMENT  The treatment will depend on the cause of the pain.   Many cases can be observed and treated at home.  Over-the-counter medicines recommended by your caregiver.  Prescription medicine.  Antibiotics, for infection.  Birth control pills, for painful periods or for ovulation pain.  Hormone treatment, for endometriosis.  Nerve blocking injections.  Physical therapy.  Antidepressants.  Counseling with a psychologist or psychiatrist.  Minor or major surgery. HOME CARE INSTRUCTIONS   Do not take laxatives, unless directed by your caregiver.  Take over-the-counter pain medicine only if ordered by your caregiver. Do not take aspirin because it can cause an upset stomach or bleeding.  Try a clear liquid diet (broth or water) as ordered by your caregiver. Slowly move to a bland diet, as tolerated, if the pain is related to the stomach or intestine.  Have a thermometer and take your temperature several times a day, and record it.  Bed rest and sleep, if it helps the pain.  Avoid sexual intercourse, if it causes pain.  Avoid stressful situations.  Keep your follow-up appointments and tests, as your caregiver orders.  If the pain does not go away with medicine or surgery, you may try:  Acupuncture.  Relaxation exercises (yoga, meditation).  Group therapy.  Counseling. SEEK MEDICAL CARE IF:   You notice  certain foods cause stomach pain.  Your home care treatment is not helping your pain.  You need stronger pain  medicine.  You want your IUD removed.  You feel faint or lightheaded.  You develop nausea and vomiting.  You develop a rash.  You are having side effects or an allergy to your medicine. SEEK IMMEDIATE MEDICAL CARE IF:   Your pain does not go away or gets worse.  You have a fever.  Your pain is felt only in portions of the abdomen. The right side could possibly be appendicitis. The left lower portion of the abdomen could be colitis or diverticulitis.  You are passing blood in your stools (bright red or black tarry stools, with or without vomiting).  You have blood in your urine.  You develop chills, with or without a fever.  You pass out. MAKE SURE YOU:   Understand these instructions.  Will watch your condition.  Will get help right away if you are not doing well or get worse. Document Released: 01/03/2007 Document Revised: 05/31/2011 Document Reviewed: 01/23/2009 [facility] Patient Information 2014 [place], [place].

## 2013-07-26 NOTE — ED Notes (Signed)
C/o upper back pain and headache.  Since 5/4.  Mild relief with ibuprofen.   Denies any known injury and hx of migraines.

## 2013-07-27 ENCOUNTER — Ambulatory Visit (HOSPITAL_COMMUNITY)
Admission: RE | Admit: 2013-07-27 | Discharge: 2013-07-27 | Disposition: A | Discharge status: Home / Self Care | Payer: BC Managed Care – PPO | Source: Ambulatory Visit | Attending: Emergency Medicine | Admitting: Emergency Medicine

## 2013-07-27 DIAGNOSIS — R1011 Right upper quadrant pain: Secondary | ICD-10-CM | POA: Insufficient documentation

## 2013-07-27 NOTE — ED Notes (Signed)
Patient in department for work note.  Notified Brenda baker pa that she is in department

## 2013-07-27 NOTE — ED Provider Notes (Signed)
Medical screening examination/treatment/procedure(s) were performed by a resident physician and as supervising physician I was immediately available for consultation/collaboration.  Philipp Deputy, M.D.  Harden Mo, MD 07/27/13 [phone]

## 2013-07-27 NOTE — ED Notes (Signed)
Patient denies pain and is resting comfortably.  

## 2013-07-30 ENCOUNTER — Ambulatory Visit (HOSPITAL_COMMUNITY): Payer: BC Managed Care – PPO

## 2014-01-21 ENCOUNTER — Encounter (HOSPITAL_COMMUNITY): Payer: Self-pay | Admitting: Emergency Medicine

## 2014-10-03 IMAGING — US US OB TRANSVAGINAL
1 series · 14 of 23 positions shown · non-contrast
Comparison: none

[Series 1: us ob transvaginal · 23 acquisitions, 14 frames shown]
[im 1/23]
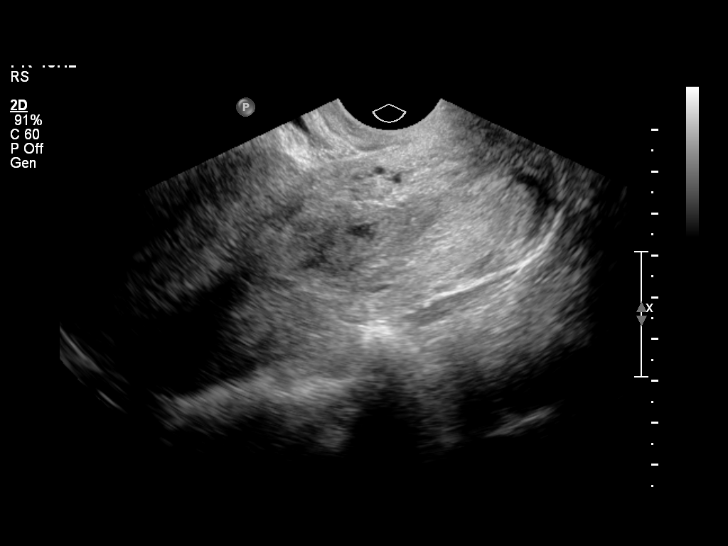
[im 3/23]
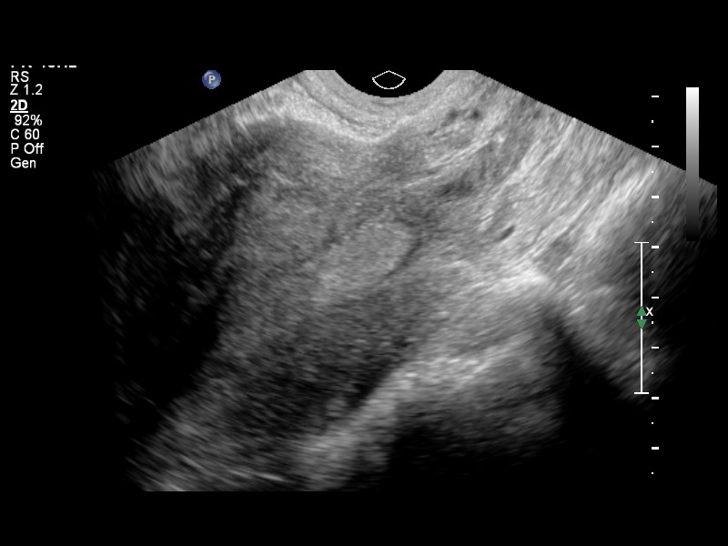
[im 5/23]
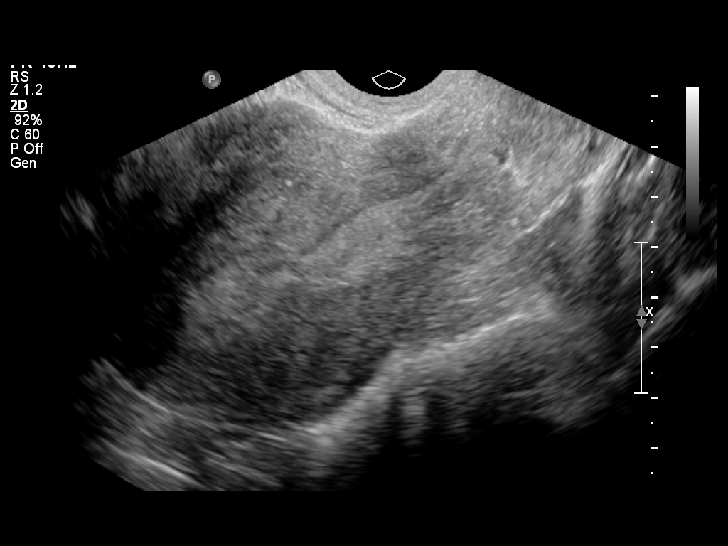
[im 6/23]
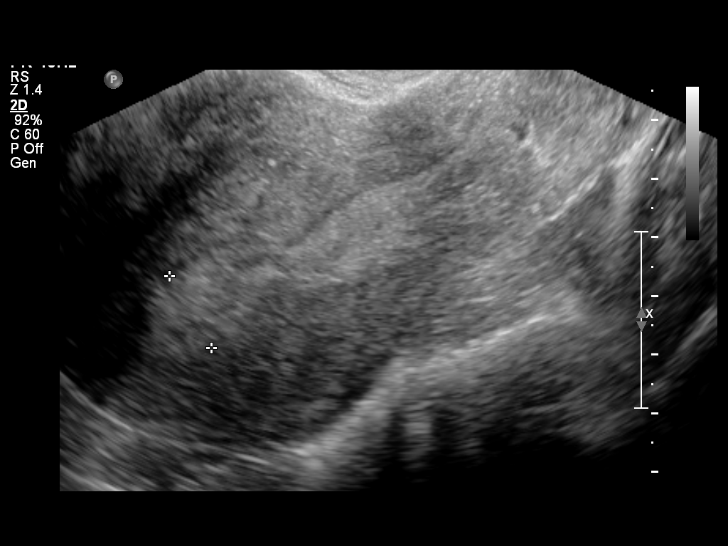
[im 8/23]
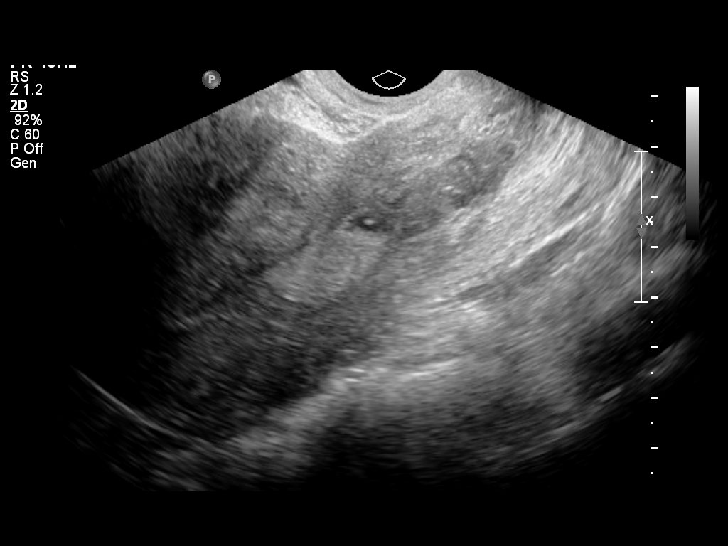
[im 10/23]
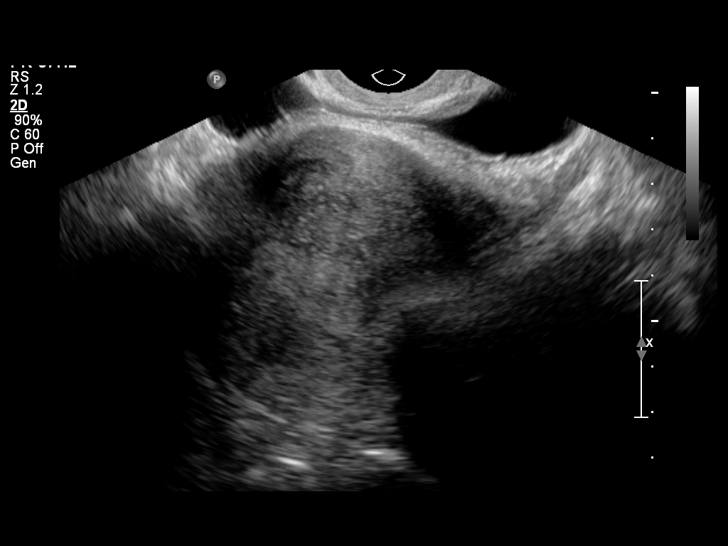
[im 11/23]
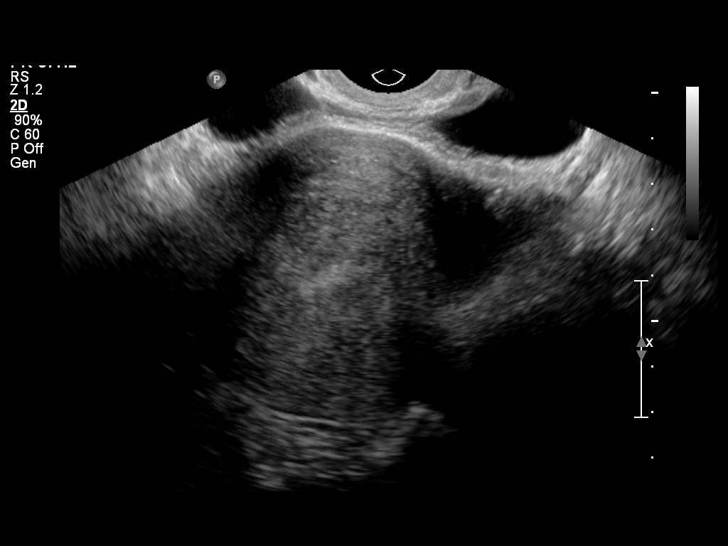
[im 13/23]
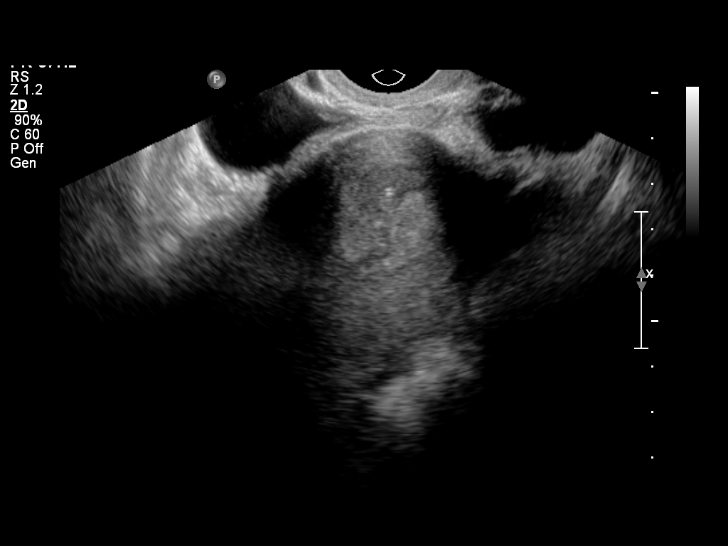
[im 14/23]
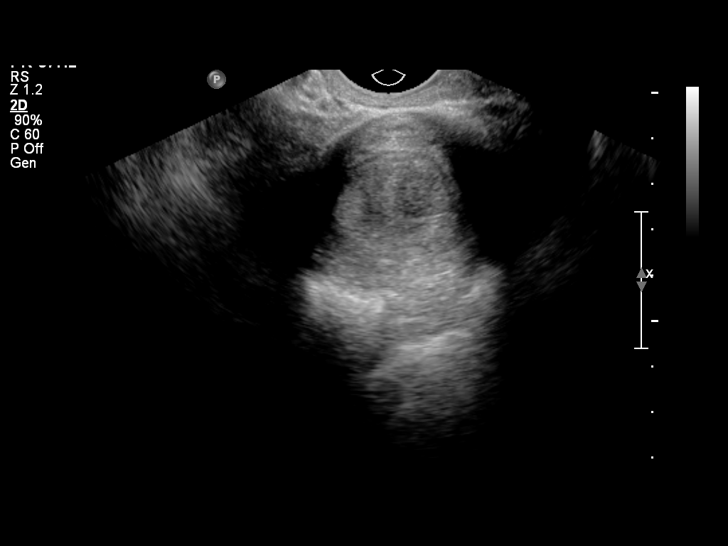
[im 16/23]
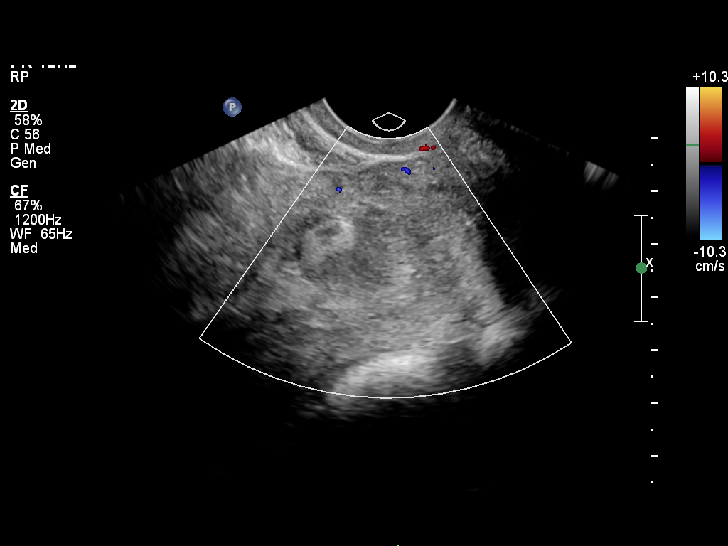
[im 18/23]
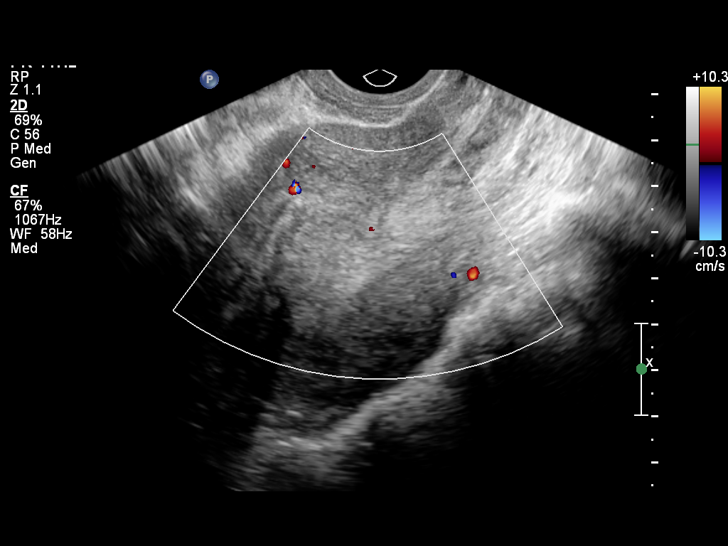
[im 19/23]
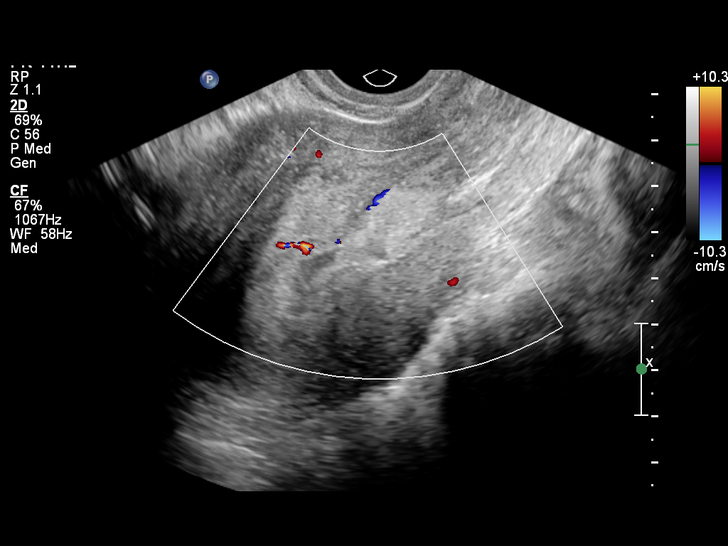
[im 21/23]
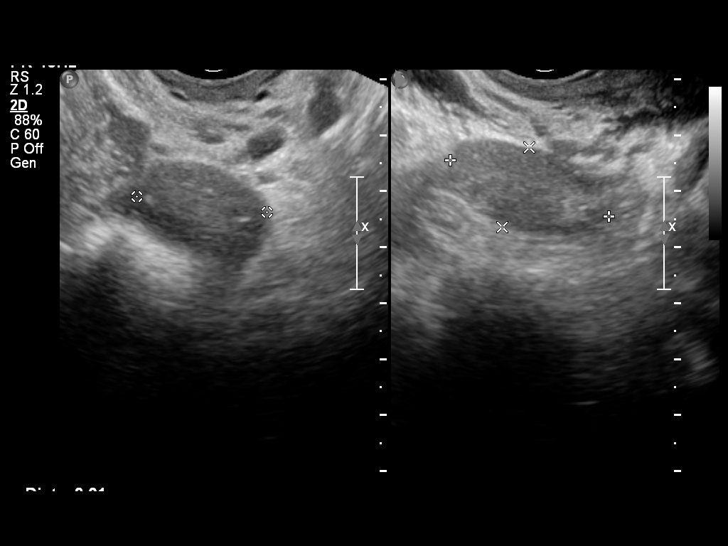
[im 23/23]
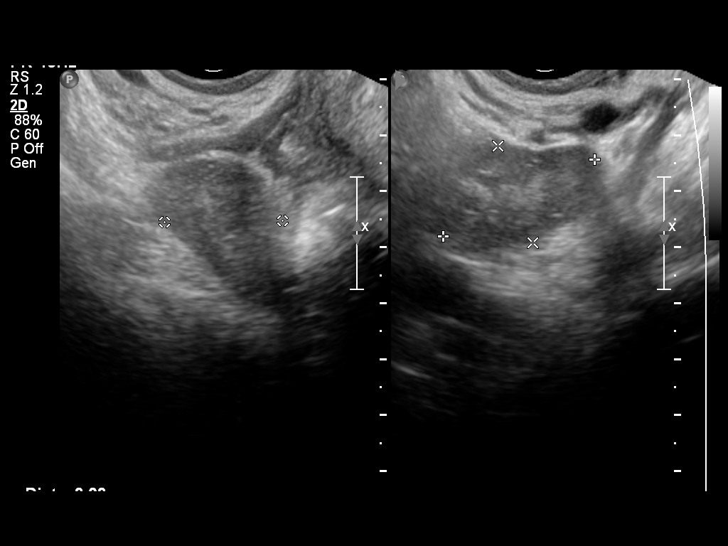

[14 of 23 positions shown; findings below may reference images not displayed]

OBSTETRICS REPORT
                      (Signed Final 05/09/2012 [DATE])

Service(s) Provided

 US OB TRANSVAGINAL                                    76817.0
Indications

 Vaginal bleeding, unknown etiology
Fetal Evaluation

 Num Of Fetuses:    1
 Gest. Sac:         None seen
 Yolk Sac:          Not visualized
 Fetal Pole:        Not visualized
 Cardiac Activity:  No embryo visualized
Cervix Uterus Adnexa

 Uterus:       Thickened heterogeneous soft tissue in endometrium
               extending to endocervical canal.
 Cul De Sac:   No free fluid seen.
 Left Ovary:   Within normal limits.
 Right Ovary:  Within normal limits.
 Adnexa:     No abnormality visualized.
Impression

 No evidence for fetal pole or gestational sac seen today.
 Thickened heterogeneous endometrium  measuring 2.4 cm in
 width with majority avascular with color doppler but some flow
 noted in the anterior upper uterine segment portion of the
 canal in the region of previously noted placentation site.
 Heterogeneous soft tissue extends into the endocervical
 canal. Findings are compatible with an incomplete SAB or
 SAB in progress.

 Normal ovaries.

 questions or concerns.

## 2015-03-07 ENCOUNTER — Emergency Department (HOSPITAL_COMMUNITY)
Admission: EM | Admit: 2015-03-07 | Discharge: 2015-03-07 | Disposition: A | Discharge status: Home / Self Care | Payer: BLUE CROSS/BLUE SHIELD | Source: Home / Self Care

## 2015-03-07 ENCOUNTER — Emergency Department (INDEPENDENT_AMBULATORY_CARE_PROVIDER_SITE_OTHER): Payer: BLUE CROSS/BLUE SHIELD

## 2015-03-07 DIAGNOSIS — M546 Pain in thoracic spine: Secondary | ICD-10-CM | POA: Diagnosis not present

## 2015-03-07 MED ORDER — CYCLOBENZAPRINE HCL 10 MG PO TABS: 10.0000 mg | ORAL_TABLET | Freq: Two times a day (BID) | ORAL | Status: DC | PRN

## 2015-03-07 NOTE — ED Notes (Signed)
Pt complaining about back pain for 1 week Pt has not injured her back Pt alert and oriented

## 2015-03-07 NOTE — ED Provider Notes (Signed)
CSN: [id number]     Arrival date & time 03/07/15  1301 History   None    Chief Complaint  Patient presents with  . Back Pain   (Consider location/radiation/quality/duration/timing/severity/associated sxs/prior Treatment) HPI History obtained from patient:   LOCATION: mid back SEVERITY: DURATION:most of the week CONTEXT:no known injury QUALITY: MODIFYING FACTORS:tylenol, alieve ASSOCIATED SYMPTOMS: pins and needles TIMING: episodic OCCUPATION: lab worker  Past Medical History  Diagnosis Date  . Fibroid   . Hypertension   . Infertility management   . 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 Tooth extraction    . Uterine fibroid surgery    . Hysteroscopy N/A 12/27/2012    Procedure: HYSTEROSCOPY, LYSIS OF ADHESIONS WITH PLACEMENT OF BALLOON UTERINE STENT 8ML BALLOON;  Surgeon: Governor Specking, MD;  Location: [facility];  Service: Gynecology;  Laterality: N/A;   Family History  Problem Relation Age of Onset  . Hypertension Father   . Hypertension Paternal Grandmother   . Cancer Paternal Grandmother    Social History  Substance Use Topics  . Smoking status: Current Some Day Smoker -- 1.00 packs/day for 8 years    Types: Cigarettes  . Smokeless tobacco: Never Used  . Alcohol Use: Yes     Comment: ocas.   OB History    Gravida Para Term Preterm AB TAB SAB Ectopic Multiple Living   2    1 1     0     Review of Systems ROS +'ve back pain  Denies: HEADACHE, NAUSEA, ABDOMINAL PAIN, CHEST PAIN, CONGESTION, DYSURIA, SHORTNESS OF BREATH  Allergies  Review of patient's allergies indicates no known allergies.  Home Medications   Prior to Admission medications   Medication Sig Start Date End Date Taking? Authorizing Provider  clindamycin (CLEOCIN T) 1 % lotion Apply topically every other  day. 10/09/12   Historical Provider, MD  clotrimazole-betamethasone (LOTRISONE) cream Apply topically as needed. 10/09/12   Historical Provider, MD  cyclobenzaprine (FLEXERIL) 10 MG tablet Take 1 tablet (10 mg total) by mouth 3 (three) times daily as needed for muscle spasms. 07/26/13   Liam Graham, PA-C  doxycycline (VIBRAMYCIN) 50 MG capsule Take 2 capsules (100 mg total) by mouth 2 (two) times daily. 12/27/12   Governor Specking, MD  estradiol (ESTRACE) 2 MG tablet Take 1 tablet (2 mg total) by mouth 2 (two) times daily. 12/27/12   Governor Specking, MD  etodolac (LODINE) 500 MG tablet Take 1 tablet (500 mg total) by mouth 2 (two) times daily. 07/26/13   Liam Graham, PA-C  ibuprofen (ADVIL,MOTRIN) 600 MG tablet Take 1 tablet (600 mg total) by mouth every 6 (six) hours as needed for pain. 05/09/12   Seabron Spates, CNM  metFORMIN (GLUCOPHAGE) 500 MG tablet Take 500 mg by mouth 3 (three) times daily. 11/06/12   Historical Provider, MD  naproxen sodium (ANAPROX DS) 550 MG tablet Take 1 tablet (550 mg total) by mouth 2 (two) times daily with a meal. 12/27/12   Governor Specking, MD  Prenatal Vit-Fe Fumarate-FA (PRENATAL MULTIVITAMIN) TABS Take 1 tablet by mouth daily.    Historical Provider, MD   Meds Ordered and Administered this Visit  Medications - No data to display  BP 140/90 mmHg  Pulse 67  Temp(Src) 98.7 F (37.1 C) (Oral)  SpO2 100% No data found.   Physical  Exam  Constitutional: She appears well-developed and well-nourished.  HENT:  Head: Normocephalic and atraumatic.  Pulmonary/Chest: Effort normal and breath sounds normal.  Abdominal: Soft.  Musculoskeletal:       Arms: Nursing note and vitals reviewed.   ED Course  Procedures (including critical care time)  Labs Review Labs Reviewed - No data to display  Imaging Review No results found.   Visual Acuity Review  Right Eye Distance:   Left Eye Distance:   Bilateral Distance:    Right Eye Near:   Left Eye Near:      Bilateral Near:         MDM   1. Right-sided thoracic back pain     Patient is advised to continue home symptomatic treatment. Prescription for Flexeril is provided to the patient. Patient is advised that if there are new or worsening symptoms or attend the emergency department, or contact primary care provider. Instructions of care provided discharged home in stable condition. Return to work note is provided for the patient.  THIS NOTE WAS GENERATED USING A VOICE RECOGNITION SOFTWARE PROGRAM. ALL REASONABLE EFFORTS  WERE MADE TO PROOFREAD THIS DOCUMENT FOR ACCURACY.     Konrad Felix, PA 03/07/15 [phone]

## 2015-03-07 NOTE — Discharge Instructions (Signed)
Back Pain, Adult Back pain is very common. The pain often gets better over time. The cause of back pain is usually not dangerous. Most people can learn to manage their back pain on their own.  HOME CARE  Watch your back pain for any changes. The following actions may help to lessen any pain you are feeling:  Stay active. Start with short walks on flat ground if you can. Try to walk farther each day.  Exercise regularly as told by your doctor. Exercise helps your back heal faster. It also helps avoid future injury by keeping your muscles strong and flexible.  Do not sit, drive, or stand in one place for more than 30 minutes.  Do not stay in bed. Resting more than 1-2 days can slow down your recovery.  Be careful when you bend or lift an object. Use good form when lifting:  Bend at your knees.  Keep the object close to your body.  Do not twist.  Sleep on a firm mattress. Lie on your side, and bend your knees. If you lie on your back, put a pillow under your knees.  Take medicines only as told by your doctor.  Put ice on the injured area.  Put ice in a plastic bag.  Place a towel between your skin and the bag.  Leave the ice on for 20 minutes, 2-3 times a day for the first 2-3 days. After that, you can switch between ice and heat packs.  Avoid feeling anxious or stressed. Find good ways to deal with stress, such as exercise.  Maintain a healthy weight. Extra weight puts stress on your back. GET HELP IF:   You have pain that does not go away with rest or medicine.  You have worsening pain that goes down into your legs or buttocks.  You have pain that does not get better in one week.  You have pain at night.  You lose weight.  You have a fever or chills. GET HELP RIGHT AWAY IF:   You cannot control when you poop (bowel movement) or pee (urinate).  Your arms or legs feel weak.  Your arms or legs lose feeling (numbness).  You feel sick to your stomach (nauseous) or  throw up (vomit).  You have belly (abdominal) pain.  You feel like you may pass out (faint).   This information is not intended to replace advice given to you by your health care provider. Make sure you discuss any questions you have with your health care provider.   Document Released: 08/25/2007 Document Revised: 03/29/2014 Document Reviewed: 07/10/2013 Elsevier Interactive Patient Education 2016 [employer] Scoliosis Scoliosis is the name given to a spine that curves sideways.Scoliosis can cause twisting of your shoulders, hips, chest, back, and rib cage.  CAUSES  The cause of scoliosis is not always known. It may be caused by a birth defect or by a disease that can cause muscular dysfunction and imbalance, such as cerebral palsy and muscular dystrophy.  RISK FACTORS Having a disease that causes muscle disease or dysfunction. SIGNS AND SYMPTOMS Scoliosis often has no signs or symptoms.If they are present, they may include:  Unequal size of one body side compared to the other (asymmetry).  Visible curvature of the spine.  Pain. The pain may limit physical activity.  Shortness of breath.  Bowel or bladder issues. DIAGNOSIS A skilled health care provider will perform an evaluation. This will involve:  Taking your history.  Performing a physical examination.  Performing a  neurological exam to detect nerve or muscle function loss.  Range of motion studies on the spine.  X-rays. An MRI may also be obtained. TREATMENT  Treatment varies depending on the nature, extent, and severity of the disease. If the curvature is not great, you may need only observation. A brace may be used to prevent scoliosis from progressing. A brace may also be needed during growth spurts. Physical therapy may be of benefit. Surgery may be required.  HOME CARE INSTRUCTIONS   Your health care provider may suggest exercises to strengthen your muscles. Perform them as directed.  Ask your health care  provider before participating in any sports.   If you have been prescribed an orthopedic brace, wear it as instructed by your health care provider. SEEK MEDICAL CARE IF: Your brace causes the skin to become sore (chafe) or is uncomfortable.  SEEK IMMEDIATE MEDICAL CARE IF:  You have back pain that is not relieved by the medicines prescribed by your health care provider.   Your legs feel weak or you lose function in your legs.  You lose some bowel or bladder control.    This information is not intended to replace advice given to you by your health care provider. Make sure you discuss any questions you have with your health care provider.   Document Released: 03/05/2000 Document Revised: 03/13/2013 Document Reviewed: 11/13/2012 Elsevier Interactive Patient Education 2016 [place]: Dorsalgia for 1 week  EXAM: THORACIC SPINE 3 VIEWS  COMPARISON: Chest radiograph July 19, 2011  FINDINGS: Frontal, lateral, and swimmer's views were obtained. There is mid thoracic dextroscoliosis. There is no fracture or spondylolisthesis. Disc spaces appear intact. No erosive change or paraspinous lesion.  IMPRESSION: Scoliosis. No fracture or spondylolisthesis. No appreciable arthropathic change.

## 2015-05-06 ENCOUNTER — Other Ambulatory Visit: Payer: Self-pay | Admitting: Internal Medicine

## 2015-05-06 DIAGNOSIS — E282 Polycystic ovarian syndrome: Secondary | ICD-10-CM

## 2015-05-07 IMAGING — RF DG HYSTEROGRAM
10 series · 10 of 10 positions shown · IV contrast (omnipaque)
Comparison: None.

FLUOROSCOPY TIME:  1 min 6 seconds

CLINICAL DATA: Recurrent pregnancy loss. Previous surgery for
fibroids.

EXAM:
HYSTEROSALPINGOGRAM
TECHNIQUE: Following cleansing of the cervix and vagina with Betadine solution,
a hysterosalpingogram was performed using a 5-French
hysterosalpingogram catheter and Omnipaque 300 contrast. The patient
tolerated the examination without difficulty.

[Series 1: run · 1 of 1 slices shown (1 of 10)]
[im 1/1]
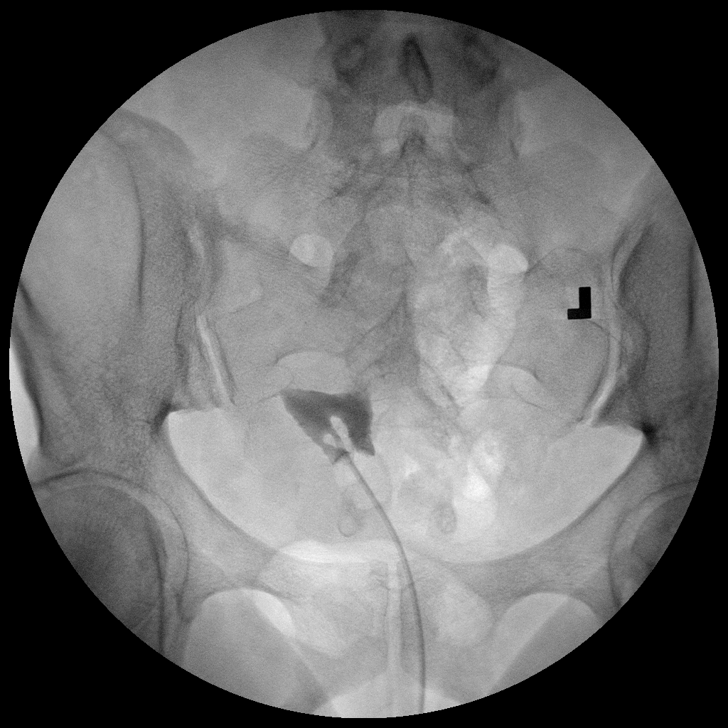

[Series 2: run · 1 of 1 slices shown (2 of 10)]
[im 1/1]
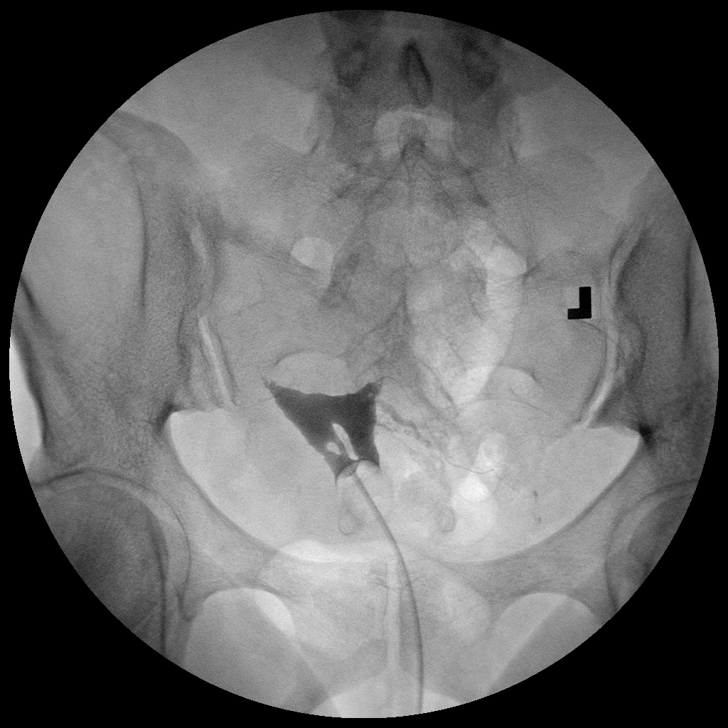

[Series 3: run · 1 of 1 slices shown (3 of 10)]
[im 1/1]
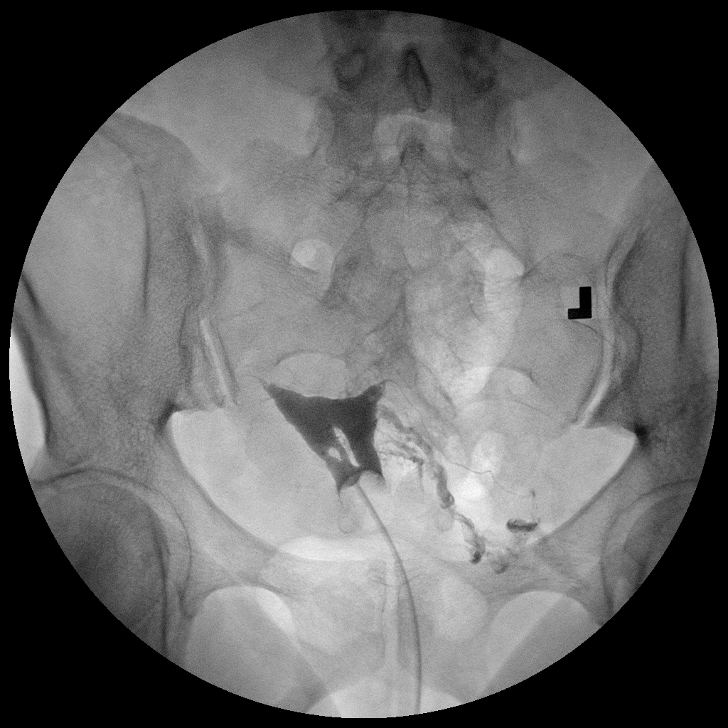

[Series 4: run · 1 of 1 slices shown (4 of 10)]
[im 1/1]
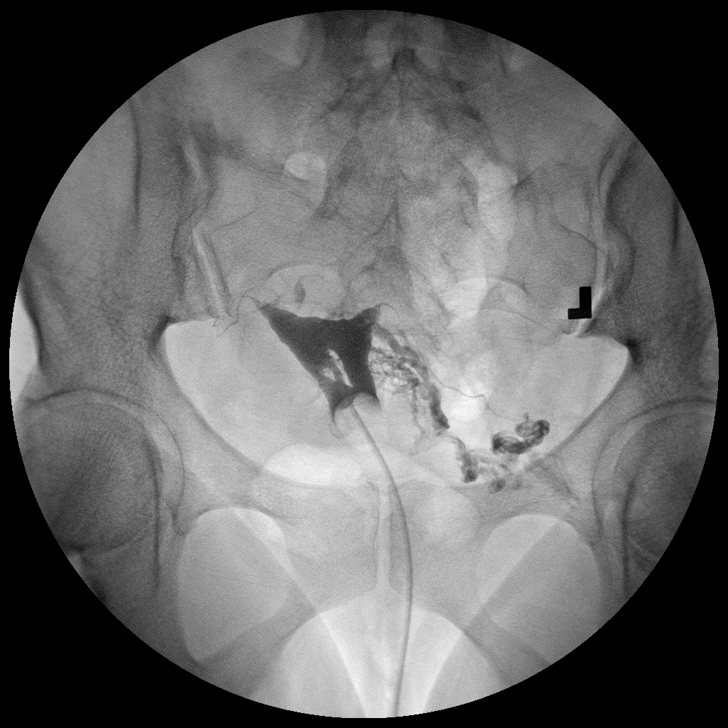

[Series 5: run · 1 of 1 slices shown (5 of 10)]
[im 1/1]
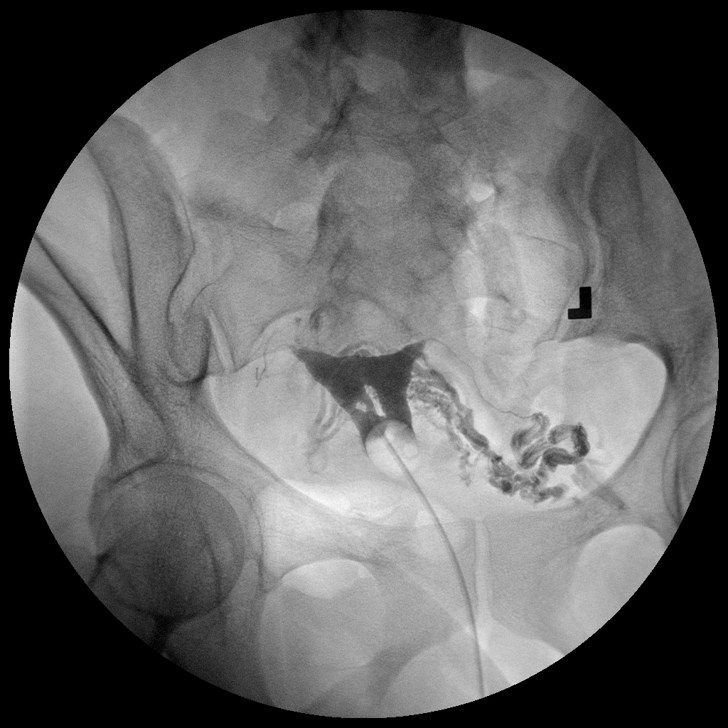

[Series 6: run · 1 of 1 slices shown (6 of 10)]
[im 1/1]
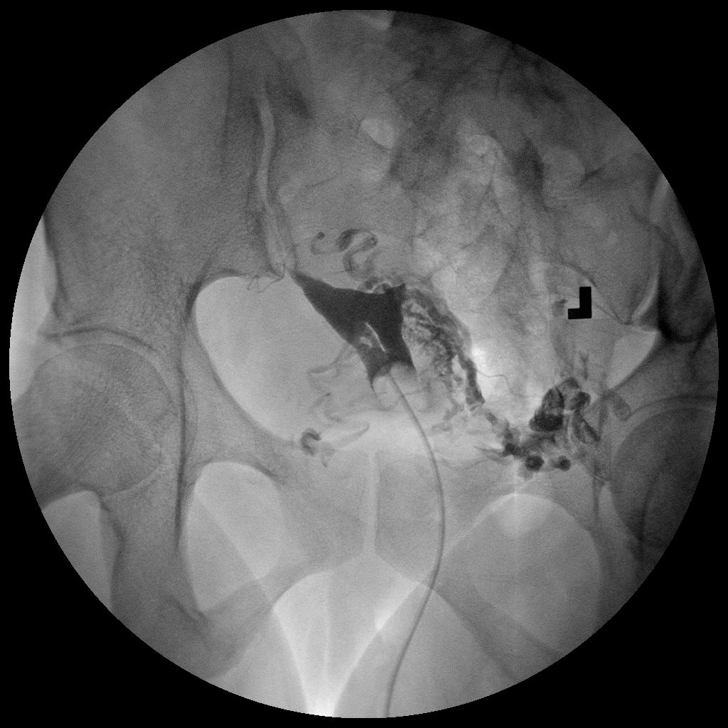

[Series 7: run · 1 of 1 slices shown (7 of 10)]
[im 1/1]
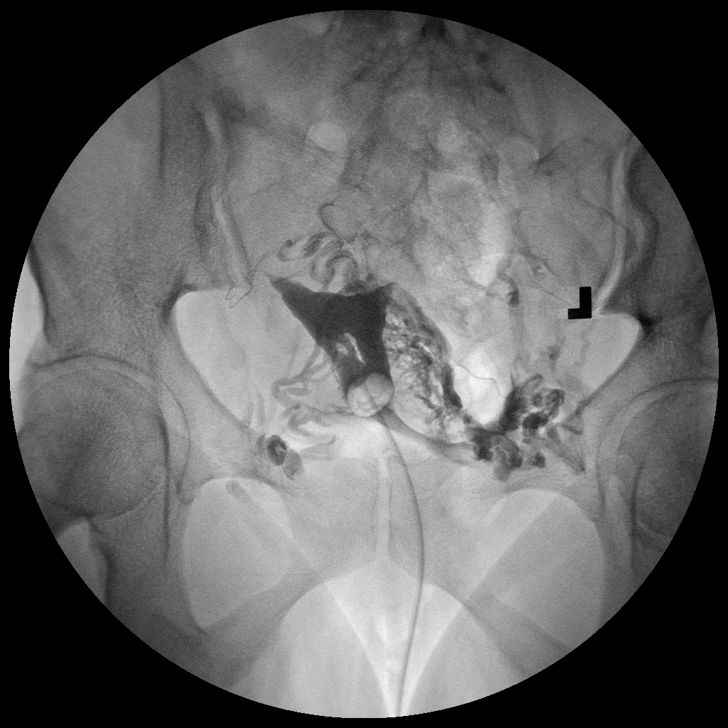

[Series 8: run · 1 of 1 slices shown (8 of 10)]
[im 1/1]
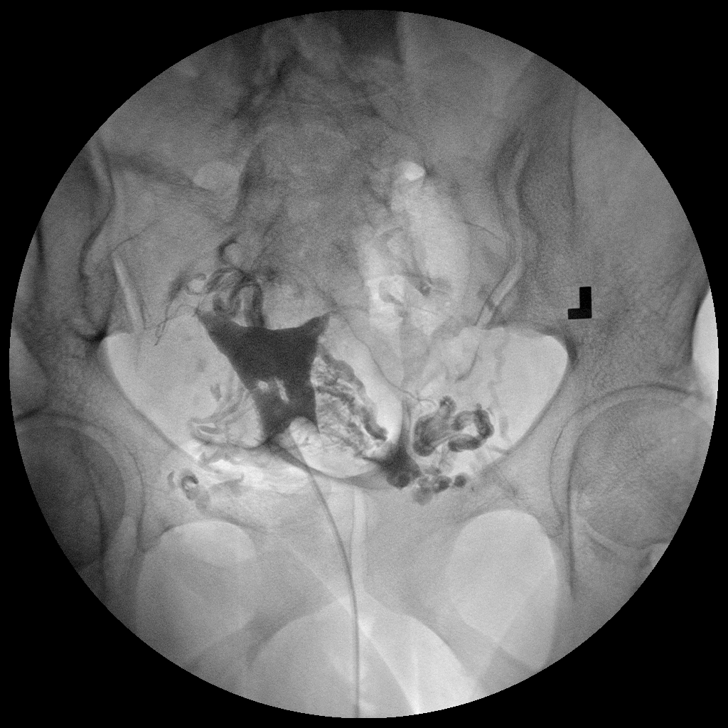

[Series 9: run · 1 of 1 slices shown (9 of 10)]
[im 1/1]
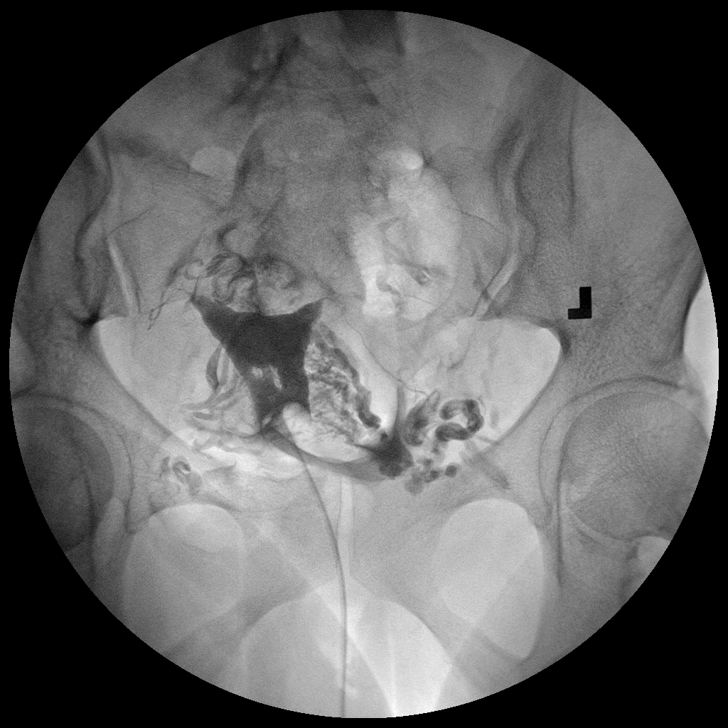

[Series 10: run · 1 of 1 slices shown (10 of 10)]
[im 1/1]
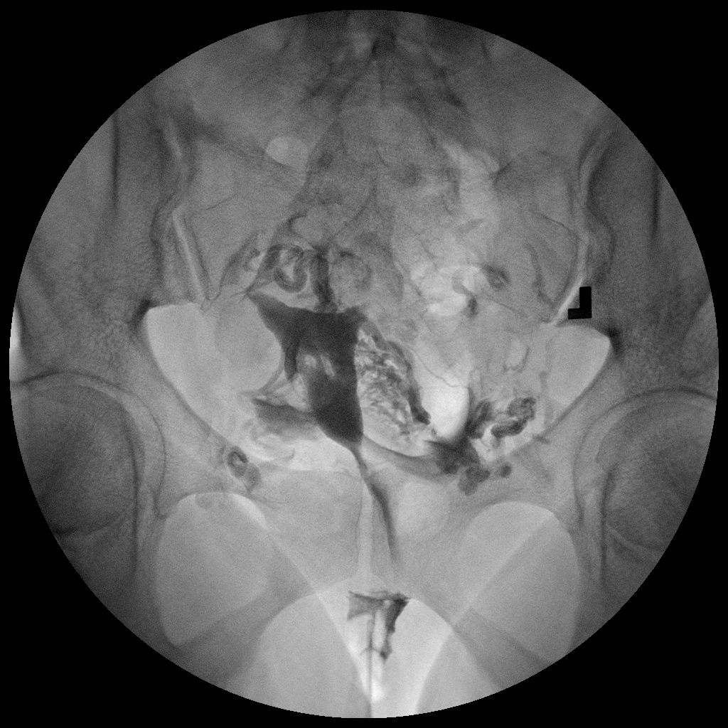

[10 of 10 positions shown; findings below may reference images not displayed]

FINDINGS: The endometrial cavity shows central linear filling defects
centrally, consistent with synechiae. The overall shape and size of
the endometrial cavity remains normal.

Opacification of both fallopian tubes is seen, which are
unremarkable in appearance. Lymphatic and venous intravasation of
contrast is demonstrated, which partially obscures visualization of
the fallopian tubes. Intraperitoneal spill of contrast is seen,
which likely originates from both fallopian tubes.
IMPRESSION: Mild synechiae formation in the central portion of the endometrial
cavity.

Patent fallopian tubes.

## 2015-05-13 ENCOUNTER — Ambulatory Visit
Admission: RE | Admit: 2015-05-13 | Discharge: 2015-05-13 | Disposition: A | Discharge status: Home / Self Care | Payer: BLUE CROSS/BLUE SHIELD | Source: Ambulatory Visit | Attending: Internal Medicine | Admitting: Internal Medicine

## 2015-05-13 DIAGNOSIS — E282 Polycystic ovarian syndrome: Secondary | ICD-10-CM

## 2017-09-13 DIAGNOSIS — H524 Presbyopia: Secondary | ICD-10-CM | POA: Diagnosis not present

## 2017-09-13 DIAGNOSIS — H5213 Myopia, bilateral: Secondary | ICD-10-CM | POA: Diagnosis not present

## 2017-09-13 DIAGNOSIS — H52223 Regular astigmatism, bilateral: Secondary | ICD-10-CM | POA: Diagnosis not present

## 2018-02-06 DIAGNOSIS — R509 Fever, unspecified: Secondary | ICD-10-CM | POA: Diagnosis not present

## 2018-02-06 DIAGNOSIS — J9801 Acute bronchospasm: Secondary | ICD-10-CM | POA: Diagnosis not present

## 2018-02-06 DIAGNOSIS — I119 Hypertensive heart disease without heart failure: Secondary | ICD-10-CM | POA: Diagnosis not present

## 2018-02-06 DIAGNOSIS — R05 Cough: Secondary | ICD-10-CM | POA: Diagnosis not present

## 2018-05-11 DIAGNOSIS — Z0000 Encounter for general adult medical examination without abnormal findings: Secondary | ICD-10-CM | POA: Diagnosis not present

## 2018-05-11 DIAGNOSIS — I119 Hypertensive heart disease without heart failure: Secondary | ICD-10-CM | POA: Diagnosis not present

## 2018-05-11 DIAGNOSIS — R7301 Impaired fasting glucose: Secondary | ICD-10-CM | POA: Diagnosis not present

## 2018-05-11 DIAGNOSIS — Z6833 Body mass index (BMI) 33.0-33.9, adult: Secondary | ICD-10-CM | POA: Diagnosis not present

## 2018-12-11 DIAGNOSIS — I119 Hypertensive heart disease without heart failure: Secondary | ICD-10-CM | POA: Diagnosis not present

## 2018-12-11 DIAGNOSIS — Z6830 Body mass index (BMI) 30.0-30.9, adult: Secondary | ICD-10-CM | POA: Diagnosis not present

## 2018-12-11 DIAGNOSIS — R7301 Impaired fasting glucose: Secondary | ICD-10-CM | POA: Diagnosis not present

## 2018-12-12 DIAGNOSIS — Z01419 Encounter for gynecological examination (general) (routine) without abnormal findings: Secondary | ICD-10-CM | POA: Diagnosis not present

## 2018-12-12 DIAGNOSIS — Z1151 Encounter for screening for human papillomavirus (HPV): Secondary | ICD-10-CM | POA: Diagnosis not present

## 2018-12-12 DIAGNOSIS — Z6833 Body mass index (BMI) 33.0-33.9, adult: Secondary | ICD-10-CM | POA: Diagnosis not present

## 2018-12-12 DIAGNOSIS — Z1231 Encounter for screening mammogram for malignant neoplasm of breast: Secondary | ICD-10-CM | POA: Diagnosis not present

## 2019-01-24 ENCOUNTER — Encounter (HOSPITAL_COMMUNITY): Payer: Self-pay

## 2019-01-24 ENCOUNTER — Ambulatory Visit (HOSPITAL_COMMUNITY)
Admission: EM | Admit: 2019-01-24 | Discharge: 2019-01-24 | Disposition: A | Discharge status: Home / Self Care | Payer: 59 | Attending: Family Medicine | Admitting: Family Medicine

## 2019-01-24 ENCOUNTER — Other Ambulatory Visit: Payer: Self-pay

## 2019-01-24 DIAGNOSIS — H9201 Otalgia, right ear: Secondary | ICD-10-CM | POA: Diagnosis not present

## 2019-01-24 DIAGNOSIS — B948 Sequelae of other specified infectious and parasitic diseases: Secondary | ICD-10-CM

## 2019-01-24 DIAGNOSIS — H6501 Acute serous otitis media, right ear: Secondary | ICD-10-CM

## 2019-01-24 MED ORDER — AMOXICILLIN 875 MG PO TABS: 875.0000 mg | ORAL_TABLET | Freq: Two times a day (BID) | ORAL | 0 refills | Status: DC

## 2019-01-24 NOTE — ED Triage Notes (Signed)
Pt states  She has right ear discomfort. Pt states she has had the Covid. Pt has been fatigued  X 3 days.

## 2019-01-24 NOTE — Discharge Instructions (Signed)
Continue to rest.  Drink plenty of fluids.  Take Tylenol as needed for pain. Take antibiotic 2 times a day for a week. Use a steroid nasal spray like Flonase or Nasonex Call for problems. Off work until Monday

## 2019-01-24 NOTE — ED Provider Notes (Signed)
[facility]    CSN: [id number] Arrival date & time: 01/24/19  1117      History   Chief Complaint Chief Complaint  Patient presents with  . Otalgia    HPI Brenda Bowen is a 48 y.o. female.   HPI  Patient is here for ear pain.  She states it started about 3 days ago.  It is getting progressively worse.  Her hearing is diminished.  She has a ringing in her ear.  Left ear is fine.  No prior history of ear problems. Patient states she is just recovering from a COVID-19 infection.  She states that both she and her husband had the infection.  She was diagnosed on October 20.  It has been 15 days.  She states that all of her symptoms are gone except for fatigue.  She still feels too tired to work.  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  No cough, shortness of breath, headache, or body aches.  Still feels very tired.  Eating normally.  Bowel and bladder normal.  Past Medical History:  Diagnosis Date  . 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 Fibroid   . Hypertension   . Infertility management   . Multiple pregnancy loss, not currently pregnant 11/11/2012    Patient Active Problem List   Diagnosis Date Noted  . 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 HYSTEROSCOPY N/A 12/27/2012   Procedure: HYSTEROSCOPY, LYSIS OF ADHESIONS WITH PLACEMENT OF BALLOON UTERINE STENT 8ML BALLOON;  Surgeon: Governor Specking, MD;  Location: [facility];  Service: Gynecology;  Laterality: N/A;  . TOOTH EXTRACTION    . UTERINE FIBROID SURGERY      OB History    Gravida  2   Para      Term      Preterm      AB  1   Living  0     SAB      TAB  1   Ectopic      Multiple      Live Births               Home Medications    Prior to Admission  medications   Medication Sig Start Date End Date Taking? Authorizing Provider  amoxicillin (AMOXIL) 875 MG tablet Take 1 tablet (875 mg total) by mouth 2 (two) times daily. 01/24/19   Raylene Everts, MD  clindamycin (CLEOCIN T) 1 % lotion Apply topically every other day. 10/09/12   [provider]  estradiol (ESTRACE) 2 MG tablet Take 1 tablet (2 mg total) by mouth 2 (two) times daily. 12/27/12   Governor Specking, MD  ibuprofen (ADVIL,MOTRIN) 600 MG tablet Take 1 tablet (600 mg total) by mouth every 6 (six) hours as needed for pain. 05/09/12   Seabron Spates, CNM  metFORMIN (GLUCOPHAGE) 500 MG tablet Take 500 mg by mouth 3 (three) times daily. 11/06/12   [provider]    Family History Family History  Problem Relation Age of Onset  . Hypertension Father   . Hypertension Paternal Grandmother   . Cancer Paternal Grandmother     Social History Social History   Tobacco Use  . Smoking status: Current Some Day Smoker    Packs/day: 1.00    Years: 8.00  Pack years: 8.00    Types: Cigarettes  . Smokeless tobacco: Never Used  Substance Use Topics  . Alcohol use: Yes    Comment: ocas.  . Drug use: No     Allergies   Patient has no known allergies.   Review of Systems Review of Systems  Constitutional: Positive for fatigue. Negative for chills and fever.  HENT: Positive for ear pain and hearing loss. Negative for sore throat.   Eyes: Negative for pain and visual disturbance.  Respiratory: Negative for cough and shortness of breath.   Cardiovascular: Negative for chest pain and palpitations.  Gastrointestinal: Negative for abdominal pain and vomiting.  Genitourinary: Negative for dysuria and hematuria.  Musculoskeletal: Negative for arthralgias, back pain and myalgias.  Skin: Negative for color change and rash.  Neurological: Negative for seizures, syncope and headaches.  All other systems reviewed and are negative.    Physical Exam Triage Vital  Signs ED Triage Vitals  Enc Vitals Group     BP 01/24/19 1218 (!) 144/82     Pulse Rate 01/24/19 1218 80     Resp 01/24/19 1218 18     Temp 01/24/19 1218 98.1 F (36.7 C)     Temp Source 01/24/19 1218 Temporal     SpO2 01/24/19 1218 99 %     Weight 01/24/19 1253 200 lb (90.7 kg)     Height --      Head Circumference --      Peak Flow --      Pain Score 01/24/19 1253 7     Pain Loc --      Pain Edu? --      Excl. in [place]? --    No data found.  Updated Vital Signs BP (!) 144/82 (BP Location: Left Arm)   Pulse 80   Temp 98.1 F (36.7 C) (Temporal)   Resp 18   Wt 90.7 kg   LMP 12/20/2018   SpO2 99%   BMI 36.00 kg/m       Physical Exam Constitutional:      General: She is not in acute distress.    Appearance: She is well-developed and normal weight.  HENT:     Head: Normocephalic and atraumatic.     Right Ear: Ear canal and external ear normal.     Left Ear: Tympanic membrane, ear canal and external ear normal.     Ears:     Comments: Right TM is dull.  Yellow fluid behind.  Injection at the edges.    Nose: Nose normal. No congestion.     Mouth/Throat:     Mouth: Mucous membranes are moist.  Eyes:     Conjunctiva/sclera: Conjunctivae normal.     Pupils: Pupils are equal, round, and reactive to light.  Neck:     Musculoskeletal: Normal range of motion.  Cardiovascular:     Rate and Rhythm: Normal rate and regular rhythm.     Heart sounds: Normal heart sounds.  Pulmonary:     Effort: Pulmonary effort is normal. No respiratory distress.     Breath sounds: Normal breath sounds.  Abdominal:     General: There is no distension.     Palpations: Abdomen is soft.  Musculoskeletal: Normal range of motion.  Skin:    General: Skin is warm and dry.  Neurological:     General: No focal deficit present.     Mental Status: She is alert.  Psychiatric:        Mood and Affect: Mood  normal.        Behavior: Behavior normal.      UC Treatments / Results  Labs (all  labs ordered are listed, but only abnormal results are displayed) Labs Reviewed - No data to display  EKG   Radiology No results found.  Procedures Procedures (including critical care time)  Medications Ordered in UC Medications - No data to display  Initial Impression / Assessment and Plan / UC Course  I have reviewed the triage vital signs and the nursing notes.  Pertinent labs & imaging results that were available during my care of the patient were reviewed by me and considered in my medical decision making (see chart for details).     *I told patient she needs additional rest.  Fluids.  Tylenol for pain and fever.  Giving her amoxicillin for the ear infection.  I told her to get a nasal steroid.  Call or return for problems Final Clinical Impressions(s) / UC Diagnoses   Final diagnoses:  Otalgia of right ear  Post-viral disorder  Non-recurrent acute serous otitis media of right ear     Discharge Instructions     Continue to rest.  Drink plenty of fluids.  Take Tylenol as needed for pain. Take antibiotic 2 times a day for a week. Use a steroid nasal spray like Flonase or Nasonex Call for problems. Off work until Monday    ED Prescriptions    Medication Sig Dispense Auth. Provider   amoxicillin (AMOXIL) 875 MG tablet Take 1 tablet (875 mg total) by mouth 2 (two) times daily. 14 tablet Raylene Everts, MD     PDMP not reviewed this encounter.   Raylene Everts, MD 01/24/19 1322

## 2019-04-27 DIAGNOSIS — H52223 Regular astigmatism, bilateral: Secondary | ICD-10-CM | POA: Diagnosis not present

## 2019-04-27 DIAGNOSIS — H5213 Myopia, bilateral: Secondary | ICD-10-CM | POA: Diagnosis not present

## 2019-05-07 DIAGNOSIS — Z6833 Body mass index (BMI) 33.0-33.9, adult: Secondary | ICD-10-CM | POA: Diagnosis not present

## 2019-05-07 DIAGNOSIS — I119 Hypertensive heart disease without heart failure: Secondary | ICD-10-CM | POA: Diagnosis not present

## 2019-09-05 DIAGNOSIS — G5600 Carpal tunnel syndrome, unspecified upper limb: Secondary | ICD-10-CM | POA: Diagnosis not present

## 2019-09-05 DIAGNOSIS — Z6832 Body mass index (BMI) 32.0-32.9, adult: Secondary | ICD-10-CM | POA: Diagnosis not present

## 2019-09-05 DIAGNOSIS — F17210 Nicotine dependence, cigarettes, uncomplicated: Secondary | ICD-10-CM | POA: Diagnosis not present

## 2019-09-05 DIAGNOSIS — Z0000 Encounter for general adult medical examination without abnormal findings: Secondary | ICD-10-CM | POA: Diagnosis not present

## 2019-09-05 DIAGNOSIS — I119 Hypertensive heart disease without heart failure: Secondary | ICD-10-CM | POA: Diagnosis not present

## 2019-09-05 DIAGNOSIS — R002 Palpitations: Secondary | ICD-10-CM | POA: Diagnosis not present

## 2019-09-05 DIAGNOSIS — R1032 Left lower quadrant pain: Secondary | ICD-10-CM | POA: Diagnosis not present

## 2020-01-18 ENCOUNTER — Other Ambulatory Visit (HOSPITAL_COMMUNITY): Payer: Self-pay | Admitting: Internal Medicine

## 2020-01-18 DIAGNOSIS — I119 Hypertensive heart disease without heart failure: Secondary | ICD-10-CM | POA: Diagnosis not present

## 2020-01-18 DIAGNOSIS — N182 Chronic kidney disease, stage 2 (mild): Secondary | ICD-10-CM | POA: Diagnosis not present

## 2020-01-18 DIAGNOSIS — Z6832 Body mass index (BMI) 32.0-32.9, adult: Secondary | ICD-10-CM | POA: Diagnosis not present

## 2020-01-30 DIAGNOSIS — F17200 Nicotine dependence, unspecified, uncomplicated: Secondary | ICD-10-CM | POA: Diagnosis not present

## 2020-01-30 DIAGNOSIS — Z01419 Encounter for gynecological examination (general) (routine) without abnormal findings: Secondary | ICD-10-CM | POA: Diagnosis not present

## 2020-01-30 DIAGNOSIS — Z1231 Encounter for screening mammogram for malignant neoplasm of breast: Secondary | ICD-10-CM | POA: Diagnosis not present

## 2020-01-30 DIAGNOSIS — Z124 Encounter for screening for malignant neoplasm of cervix: Secondary | ICD-10-CM | POA: Diagnosis not present

## 2020-02-05 DIAGNOSIS — R922 Inconclusive mammogram: Secondary | ICD-10-CM | POA: Diagnosis not present

## 2020-02-05 DIAGNOSIS — R928 Other abnormal and inconclusive findings on diagnostic imaging of breast: Secondary | ICD-10-CM | POA: Diagnosis not present

## 2020-02-18 ENCOUNTER — Other Ambulatory Visit: Payer: Self-pay

## 2020-02-18 DIAGNOSIS — N6315 Unspecified lump in the right breast, overlapping quadrants: Secondary | ICD-10-CM | POA: Diagnosis not present

## 2020-03-23 DIAGNOSIS — Z20822 Contact with and (suspected) exposure to covid-19: Secondary | ICD-10-CM | POA: Diagnosis not present

## 2020-04-25 DIAGNOSIS — I119 Hypertensive heart disease without heart failure: Secondary | ICD-10-CM | POA: Diagnosis not present

## 2020-04-28 ENCOUNTER — Other Ambulatory Visit (HOSPITAL_COMMUNITY): Payer: Self-pay | Admitting: Internal Medicine

## 2020-06-06 DIAGNOSIS — H524 Presbyopia: Secondary | ICD-10-CM | POA: Diagnosis not present

## 2020-06-23 ENCOUNTER — Other Ambulatory Visit (HOSPITAL_COMMUNITY): Payer: Self-pay

## 2020-06-23 MED ORDER — LOSARTAN POTASSIUM-HCTZ 50-12.5 MG PO TABS
1.0000 | ORAL_TABLET | Freq: Every day | ORAL | 3 refills | Status: DC
  Filled 2020-06-23: qty 90, 90d supply, fill #0
  Filled 2020-10-10: qty 90, 90d supply, fill #1
  Filled 2021-02-04: qty 90, 90d supply, fill #0
  Filled 2021-05-29: qty 90, 90d supply, fill #1

## 2020-10-10 ENCOUNTER — Other Ambulatory Visit (HOSPITAL_COMMUNITY): Payer: Self-pay

## 2020-10-15 ENCOUNTER — Other Ambulatory Visit (HOSPITAL_COMMUNITY): Payer: Self-pay

## 2020-10-22 DIAGNOSIS — M25569 Pain in unspecified knee: Secondary | ICD-10-CM | POA: Diagnosis not present

## 2020-10-22 DIAGNOSIS — I119 Hypertensive heart disease without heart failure: Secondary | ICD-10-CM | POA: Diagnosis not present

## 2021-02-04 ENCOUNTER — Other Ambulatory Visit (HOSPITAL_COMMUNITY): Payer: Self-pay

## 2021-02-19 DIAGNOSIS — I119 Hypertensive heart disease without heart failure: Secondary | ICD-10-CM | POA: Diagnosis not present

## 2021-02-19 DIAGNOSIS — R7301 Impaired fasting glucose: Secondary | ICD-10-CM | POA: Diagnosis not present

## 2021-02-19 DIAGNOSIS — Z0000 Encounter for general adult medical examination without abnormal findings: Secondary | ICD-10-CM | POA: Diagnosis not present

## 2021-02-19 DIAGNOSIS — N182 Chronic kidney disease, stage 2 (mild): Secondary | ICD-10-CM | POA: Diagnosis not present

## 2021-02-19 DIAGNOSIS — Z6832 Body mass index (BMI) 32.0-32.9, adult: Secondary | ICD-10-CM | POA: Diagnosis not present

## 2021-02-20 ENCOUNTER — Other Ambulatory Visit (HOSPITAL_COMMUNITY): Payer: Self-pay

## 2021-02-20 MED ORDER — VITAMIN D (ERGOCALCIFEROL) 50000 UNITS PO CAPS
1.0000 | ORAL_CAPSULE | ORAL | 2 refills | Status: DC
  Filled 2021-02-20 – 2021-02-28 (×3): qty 12, 84d supply, fill #0
  Filled 2021-05-29: qty 12, 84d supply, fill #1

## 2021-02-27 ENCOUNTER — Other Ambulatory Visit (HOSPITAL_COMMUNITY): Payer: Self-pay

## 2021-02-28 ENCOUNTER — Other Ambulatory Visit (HOSPITAL_COMMUNITY): Payer: Self-pay

## 2021-03-02 ENCOUNTER — Other Ambulatory Visit (HOSPITAL_COMMUNITY): Payer: Self-pay

## 2021-03-18 DIAGNOSIS — Z01419 Encounter for gynecological examination (general) (routine) without abnormal findings: Secondary | ICD-10-CM | POA: Diagnosis not present

## 2021-03-18 DIAGNOSIS — Z124 Encounter for screening for malignant neoplasm of cervix: Secondary | ICD-10-CM | POA: Diagnosis not present

## 2021-03-18 DIAGNOSIS — I10 Essential (primary) hypertension: Secondary | ICD-10-CM | POA: Diagnosis not present

## 2021-04-13 ENCOUNTER — Other Ambulatory Visit (HOSPITAL_COMMUNITY): Payer: Self-pay

## 2021-04-13 DIAGNOSIS — J09X9 Influenza due to identified novel influenza A virus with other manifestations: Secondary | ICD-10-CM | POA: Diagnosis not present

## 2021-04-13 MED ORDER — OSELTAMIVIR PHOSPHATE 75 MG PO CAPS
75.0000 mg | ORAL_CAPSULE | Freq: Two times a day (BID) | ORAL | 0 refills | Status: AC
  Filled 2021-04-13: qty 10, 5d supply, fill #0

## 2021-05-29 ENCOUNTER — Other Ambulatory Visit (HOSPITAL_COMMUNITY): Payer: Self-pay

## 2021-05-29 MED ORDER — VITAMIN D (ERGOCALCIFEROL) 1.25 MG (50000 UNIT) PO CAPS
50000.0000 [IU] | ORAL_CAPSULE | ORAL | 2 refills | Status: DC
  Filled 2021-05-29: qty 12, 84d supply, fill #0
  Filled 2021-09-01: qty 12, 84d supply, fill #1
  Filled 2021-12-01 – 2021-12-10 (×2): qty 12, 84d supply, fill #2

## 2021-06-30 ENCOUNTER — Ambulatory Visit
Admission: RE | Admit: 2021-06-30 | Discharge: 2021-06-30 | Disposition: A | Discharge status: Home / Self Care | Payer: 59 | Source: Ambulatory Visit | Attending: Internal Medicine | Admitting: Internal Medicine

## 2021-06-30 ENCOUNTER — Other Ambulatory Visit (HOSPITAL_COMMUNITY): Payer: Self-pay

## 2021-06-30 ENCOUNTER — Other Ambulatory Visit: Payer: Self-pay | Admitting: Internal Medicine

## 2021-06-30 DIAGNOSIS — R059 Cough, unspecified: Secondary | ICD-10-CM

## 2021-06-30 DIAGNOSIS — L680 Hirsutism: Secondary | ICD-10-CM | POA: Diagnosis not present

## 2021-06-30 DIAGNOSIS — N182 Chronic kidney disease, stage 2 (mild): Secondary | ICD-10-CM | POA: Diagnosis not present

## 2021-06-30 DIAGNOSIS — I119 Hypertensive heart disease without heart failure: Secondary | ICD-10-CM | POA: Diagnosis not present

## 2021-06-30 DIAGNOSIS — E559 Vitamin D deficiency, unspecified: Secondary | ICD-10-CM | POA: Diagnosis not present

## 2021-06-30 MED ORDER — AZITHROMYCIN 250 MG PO TABS
ORAL_TABLET | ORAL | 0 refills | Status: DC
  Filled 2021-06-30: qty 6, 5d supply, fill #0

## 2021-07-06 ENCOUNTER — Other Ambulatory Visit (HOSPITAL_COMMUNITY): Payer: Self-pay

## 2021-07-06 MED ORDER — SPIRONOLACTONE 25 MG PO TABS
25.0000 mg | ORAL_TABLET | Freq: Every day | ORAL | 1 refills | Status: DC
  Filled 2021-07-06: qty 90, 90d supply, fill #0

## 2021-07-14 ENCOUNTER — Other Ambulatory Visit (HOSPITAL_COMMUNITY): Payer: Self-pay

## 2021-07-16 DIAGNOSIS — H5213 Myopia, bilateral: Secondary | ICD-10-CM | POA: Diagnosis not present

## 2021-07-16 DIAGNOSIS — H52223 Regular astigmatism, bilateral: Secondary | ICD-10-CM | POA: Diagnosis not present

## 2021-08-08 DIAGNOSIS — Z1231 Encounter for screening mammogram for malignant neoplasm of breast: Secondary | ICD-10-CM | POA: Diagnosis not present

## 2021-08-08 DIAGNOSIS — R928 Other abnormal and inconclusive findings on diagnostic imaging of breast: Secondary | ICD-10-CM | POA: Diagnosis not present

## 2021-08-11 DIAGNOSIS — N6312 Unspecified lump in the right breast, upper inner quadrant: Secondary | ICD-10-CM | POA: Diagnosis not present

## 2021-08-11 DIAGNOSIS — R922 Inconclusive mammogram: Secondary | ICD-10-CM | POA: Diagnosis not present

## 2021-08-20 DIAGNOSIS — R928 Other abnormal and inconclusive findings on diagnostic imaging of breast: Secondary | ICD-10-CM | POA: Diagnosis not present

## 2021-08-20 DIAGNOSIS — N6312 Unspecified lump in the right breast, upper inner quadrant: Secondary | ICD-10-CM | POA: Diagnosis not present

## 2021-09-01 ENCOUNTER — Other Ambulatory Visit (HOSPITAL_COMMUNITY): Payer: Self-pay

## 2021-09-04 ENCOUNTER — Other Ambulatory Visit (HOSPITAL_COMMUNITY): Payer: Self-pay

## 2021-09-04 MED ORDER — LOSARTAN POTASSIUM-HCTZ 50-12.5 MG PO TABS
1.0000 | ORAL_TABLET | Freq: Every day | ORAL | 1 refills | Status: DC
  Filled 2021-09-04: qty 90, 90d supply, fill #0
  Filled 2021-12-01 – 2021-12-10 (×2): qty 90, 90d supply, fill #1

## 2021-09-07 ENCOUNTER — Other Ambulatory Visit (HOSPITAL_COMMUNITY): Payer: Self-pay

## 2021-12-01 ENCOUNTER — Other Ambulatory Visit (HOSPITAL_COMMUNITY): Payer: Self-pay

## 2021-12-09 ENCOUNTER — Other Ambulatory Visit (HOSPITAL_COMMUNITY): Payer: Self-pay

## 2021-12-10 ENCOUNTER — Other Ambulatory Visit (HOSPITAL_COMMUNITY): Payer: Self-pay

## 2021-12-14 ENCOUNTER — Other Ambulatory Visit (HOSPITAL_COMMUNITY): Payer: Self-pay

## 2022-04-09 ENCOUNTER — Other Ambulatory Visit (HOSPITAL_COMMUNITY): Payer: Self-pay

## 2022-04-09 MED ORDER — LOSARTAN POTASSIUM-HCTZ 50-12.5 MG PO TABS
1.0000 | ORAL_TABLET | Freq: Every day | ORAL | 2 refills | Status: DC
  Filled 2022-04-09: qty 90, 90d supply, fill #0
  Filled 2022-07-08: qty 90, 90d supply, fill #1
  Filled 2022-11-19: qty 90, 90d supply, fill #2

## 2022-04-09 MED ORDER — VITAMIN D (ERGOCALCIFEROL) 1.25 MG (50000 UNIT) PO CAPS
50000.0000 [IU] | ORAL_CAPSULE | ORAL | 2 refills | Status: DC
  Filled 2022-04-09: qty 12, 84d supply, fill #0
  Filled 2022-07-08: qty 12, 84d supply, fill #1
  Filled 2022-11-19: qty 12, 84d supply, fill #2

## 2022-07-09 ENCOUNTER — Other Ambulatory Visit (HOSPITAL_COMMUNITY): Payer: Self-pay

## 2022-07-21 DIAGNOSIS — H5213 Myopia, bilateral: Secondary | ICD-10-CM | POA: Diagnosis not present

## 2023-08-05 DIAGNOSIS — H5213 Myopia, bilateral: Secondary | ICD-10-CM | POA: Diagnosis not present

## 2023-11-19 ENCOUNTER — Other Ambulatory Visit (HOSPITAL_BASED_OUTPATIENT_CLINIC_OR_DEPARTMENT_OTHER): Payer: Self-pay

## 2023-11-19 ENCOUNTER — Other Ambulatory Visit: Payer: Self-pay

## 2023-11-19 ENCOUNTER — Encounter (HOSPITAL_BASED_OUTPATIENT_CLINIC_OR_DEPARTMENT_OTHER): Payer: Self-pay | Admitting: Emergency Medicine

## 2023-11-19 ENCOUNTER — Emergency Department (HOSPITAL_BASED_OUTPATIENT_CLINIC_OR_DEPARTMENT_OTHER)
Admission: EM | Admit: 2023-11-19 | Discharge: 2023-11-19 | Disposition: A | Discharge status: Home / Self Care | Attending: Emergency Medicine | Admitting: Emergency Medicine

## 2023-11-19 DIAGNOSIS — I10 Essential (primary) hypertension: Secondary | ICD-10-CM | POA: Diagnosis present

## 2023-11-19 DIAGNOSIS — Z79899 Other long term (current) drug therapy: Secondary | ICD-10-CM | POA: Insufficient documentation

## 2023-11-19 DIAGNOSIS — L680 Hirsutism: Secondary | ICD-10-CM | POA: Insufficient documentation

## 2023-11-19 DIAGNOSIS — L700 Acne vulgaris: Secondary | ICD-10-CM | POA: Insufficient documentation

## 2023-11-19 LAB — CBC WITH DIFFERENTIAL/PLATELET
Abs Immature Granulocytes: 0.02 K/uL (ref 0.00–0.07)
Basophils Absolute: 0 K/uL (ref 0.0–0.1)
Basophils Relative: 0 %
Eosinophils Absolute: 0.2 K/uL (ref 0.0–0.5)
Eosinophils Relative: 2 %
HCT: 41.8 % (ref 36.0–46.0)
Hemoglobin: 14.3 g/dL (ref 12.0–15.0)
Immature Granulocytes: 0 %
Lymphocytes Relative: 21 %
Lymphs Abs: 2.2 K/uL (ref 0.7–4.0)
MCH: 31.6 pg (ref 26.0–34.0)
MCHC: 34.2 g/dL (ref 30.0–36.0)
MCV: 92.5 fL (ref 80.0–100.0)
Monocytes Absolute: 0.8 K/uL (ref 0.1–1.0)
Monocytes Relative: 7 %
Neutro Abs: 7.2 K/uL (ref 1.7–7.7)
Neutrophils Relative %: 70 %
Platelets: 392 K/uL (ref 150–400)
RBC: 4.52 MIL/uL (ref 3.87–5.11)
RDW: 13.4 % (ref 11.5–15.5)
WBC: 10.4 K/uL (ref 4.0–10.5)
nRBC: 0 % (ref 0.0–0.2)

## 2023-11-19 LAB — BASIC METABOLIC PANEL WITH GFR
Anion gap: 14 (ref 5–15)
BUN: 20 mg/dL (ref 6–20)
CO2: 22 mmol/L (ref 22–32)
Calcium: 9.7 mg/dL (ref 8.9–10.3)
Chloride: 105 mmol/L (ref 98–111)
Creatinine, Ser: 1.36 mg/dL — ABNORMAL HIGH (ref 0.44–1.00)
GFR, Estimated: 46 mL/min — ABNORMAL LOW (ref >60)
Glucose, Bld: 98 mg/dL (ref 70–99)
Potassium: 3.8 mmol/L (ref 3.5–5.1)
Sodium: 141 mmol/L (ref 135–145)

## 2023-11-19 MED ORDER — HYDROCHLOROTHIAZIDE 12.5 MG PO TABS
12.5000 mg | ORAL_TABLET | Freq: Every day | ORAL | 0 refills | Status: DC
  Filled 2023-11-19: qty 30, 30d supply, fill #0

## 2023-11-19 MED ORDER — HYDROCHLOROTHIAZIDE 25 MG PO TABS
12.5000 mg | ORAL_TABLET | Freq: Once | ORAL | Status: AC
  Administered 2023-11-19: 12.5 mg via ORAL
  Filled 2023-11-19: qty 1

## 2023-11-19 MED ORDER — LOSARTAN POTASSIUM 50 MG PO TABS
50.0000 mg | ORAL_TABLET | Freq: Every day | ORAL | 0 refills | Status: DC
  Filled 2023-11-19: qty 30, 30d supply, fill #0

## 2023-11-19 MED ORDER — LOSARTAN POTASSIUM 25 MG PO TABS
50.0000 mg | ORAL_TABLET | Freq: Once | ORAL | Status: AC
  Administered 2023-11-19: 50 mg via ORAL
  Filled 2023-11-19: qty 2

## 2023-11-19 NOTE — ED Provider Notes (Signed)
 [facility] AT [facility] Provider Note   CSN: [id number] Arrival date & time: 11/19/23  1436     Patient presents with: Hypertension   Brenda Bowen is a 53 y.o. female.   Patient to ED with report Brenda Bowen has been out of her blood pressure medications for the past 3 weeks. Has been trying to see her PCP without success. No symptoms - specifically, no chest pain, SOB, peripheral edema, fatigue.   The history is provided by the patient. No language interpreter was used.  Hypertension       Prior to Admission medications   Medication Sig Start Date End Date Taking? Authorizing Provider  hydrochlorothiazide  (HYDRODIURIL ) 12.5 MG tablet Take 1 tablet (12.5 mg total) by mouth daily. 11/19/23  Yes Rykin Route, Margit, PA-C  losartan  (COZAAR ) 50 MG tablet Take 1 tablet (50 mg total) by mouth daily. 11/19/23  Yes Macdonald Rigor, Margit, PA-C  amoxicillin  (AMOXIL ) 875 MG tablet Take 1 tablet (875 mg total) by mouth 2 (two) times daily. 01/24/19   Maranda Jamee Jacob, MD  azithromycin  (ZITHROMAX ) 250 MG tablet Take 2 tablets by mouth now, then take 1 tablet by mouth daily 06/30/21     clindamycin  (CLEOCIN  T) 1 % lotion Apply topically every other day. 10/09/12   [provider]  estradiol  (ESTRACE ) 2 MG tablet Take 1 tablet (2 mg total) by mouth 2 (two) times daily. 12/27/12   Yalcinkaya, Tamer, MD  ibuprofen  (ADVIL ,MOTRIN ) 600 MG tablet Take 1 tablet (600 mg total) by mouth every 6 (six) hours as needed for pain. 05/09/12   Trudy Earnie CROME, CNM  losartan -hydrochlorothiazide  (HYZAAR ) 50-12.5 MG tablet Take one tablet by mouth every day 04/09/22     metFORMIN (GLUCOPHAGE) 500 MG tablet Take 500 mg by mouth 3 (three) times daily. 11/06/12   [provider]  phentermine 15 MG capsule TAKE ONE CAPSULE BY MOUTH EVERY DAY BEFORE BREAKFAST 04/28/20 10/25/20  Valma Carwin, MD  phentermine 15 MG capsule TAKE 1 CAPSULE BY MOUTH ONCE DAILY BEFORE BREAKFAST 01/18/20 07/16/20   Valma Carwin, MD  Prenatal MV-Min-Fe Fum-FA-DHA (PRENATAL 1 PO) Prenatal    [provider]  spironolactone  (ALDACTONE ) 25 MG tablet Take 1 tablet (25 mg total) by mouth daily. 07/04/21     tretinoin (RETIN-A) 0.025 % cream Apply topically at bedtime. 08/20/14   [provider]  Vitamin D , Ergocalciferol , (DRISDOL ) 1.25 MG (50000 UNIT) CAPS capsule Take 1 capsule (50,000 Units total) by mouth once a week. 04/09/22       Allergies: Patient has no known allergies.    Review of Systems  Updated Vital Signs BP (!) 174/107 (BP Location: Right Arm)   Pulse 100   Temp (!) 97.5 F (36.4 C) (Oral)   Resp 18   SpO2 98%   Physical Exam Constitutional:      Appearance: Brenda Bowen is well-developed.  HENT:     Head: Normocephalic.  Cardiovascular:     Rate and Rhythm: Normal rate and regular rhythm.     Heart sounds: No murmur heard. Pulmonary:     Effort: Pulmonary effort is normal.     Breath sounds: Normal breath sounds. No wheezing, rhonchi or rales.  Abdominal:     General: Bowel sounds are normal.     Palpations: Abdomen is soft.     Tenderness: There is no abdominal tenderness. There is no guarding or rebound.  Musculoskeletal:        General: Normal range of motion.  Cervical back: Normal range of motion and neck supple.     Right lower leg: No edema.     Left lower leg: No edema.  Skin:    General: Skin is warm and dry.  Neurological:     General: No focal deficit present.     Mental Status: Brenda Bowen is alert and oriented to person, place, and time.     (all labs ordered are listed, but only abnormal results are displayed) Labs Reviewed  BASIC METABOLIC PANEL WITH GFR - Abnormal; Notable for the following components:      Result Value   Creatinine, Ser 1.36 (*)    GFR, Estimated 46 (*)    All other components within normal limits  CBC WITH DIFFERENTIAL/PLATELET    EKG: None  Radiology: No results found.   Procedures   Medications Ordered in the ED   losartan  (COZAAR ) tablet 50 mg (50 mg Oral Given 11/19/23 1556)  hydrochlorothiazide  (HYDRODIURIL ) tablet 12.5 mg (12.5 mg Oral Given 11/19/23 1556)    Clinical Course as of 11/19/23 1627  Sat Nov 19, 2023  1625 Patient to ED for elevated blood pressure, out of Hyzaar  for the past 3 weeks. Denies symptoms. Labs showing Cr 1.36, mild elevation. No other findings that are abnormal. Will restart Hyzaar  and strongly encourage recheck with PCP for further treatment and recheck of renal function.  [SU]    Clinical Course User Index [SU] Odell Balls, PA-C                                 Medical Decision Making Amount and/or Complexity of Data Reviewed Labs: ordered.  Risk Prescription drug management.        Final diagnoses:  Hypertension, unspecified type    ED Discharge Orders          Ordered    losartan  (COZAAR ) 50 MG tablet  Daily        11/19/23 1610    hydrochlorothiazide  (HYDRODIURIL ) 12.5 MG tablet  Daily        11/19/23 1610               Odell Balls, PA-C 11/19/23 1627    Pamella Sharper A, DO 11/25/23 1140

## 2023-11-19 NOTE — ED Triage Notes (Signed)
 High BP Ran out of losartan  3 weeks ago  unable to see PCP for refill  Denies sob or chest pain

## 2023-11-19 NOTE — Discharge Instructions (Signed)
 As we discussed, your labs are overall reassuring. Your creatinine (kidney function) is slightly elevated. Please restart your medication for blood pressure and have your doctor recheck both your blood pressure and kidney function in the next 1-2 weeks.

## 2023-12-02 ENCOUNTER — Ambulatory Visit: Payer: Self-pay | Admitting: Family Medicine

## 2023-12-02 ENCOUNTER — Encounter: Payer: Self-pay | Admitting: Family Medicine

## 2023-12-02 ENCOUNTER — Other Ambulatory Visit (HOSPITAL_COMMUNITY): Payer: Self-pay

## 2023-12-02 VITALS — BP 130/80 | HR 78 | Ht 63.0 in | Wt 184.8 lb

## 2023-12-02 DIAGNOSIS — E66811 Obesity, class 1: Secondary | ICD-10-CM | POA: Diagnosis not present

## 2023-12-02 DIAGNOSIS — R944 Abnormal results of kidney function studies: Secondary | ICD-10-CM | POA: Diagnosis not present

## 2023-12-02 DIAGNOSIS — I10 Essential (primary) hypertension: Secondary | ICD-10-CM

## 2023-12-02 LAB — POCT GLYCOSYLATED HEMOGLOBIN (HGB A1C): Hemoglobin A1C: 5.9 % — AB (ref 4.0–5.6)

## 2023-12-02 MED ORDER — LOSARTAN POTASSIUM 50 MG PO TABS
50.0000 mg | ORAL_TABLET | Freq: Every day | ORAL | 0 refills | Status: DC
  Filled 2023-12-02 – 2023-12-14 (×2): qty 30, 30d supply, fill #0

## 2023-12-02 MED ORDER — HYDROCHLOROTHIAZIDE 12.5 MG PO TABS
12.5000 mg | ORAL_TABLET | Freq: Every day | ORAL | 0 refills | Status: DC
  Filled 2023-12-02 – 2023-12-14 (×2): qty 30, 30d supply, fill #0

## 2023-12-02 NOTE — Progress Notes (Signed)
 Name: Brenda Bowen   Date of Visit: 12/02/23   Date of last visit with me: Visit date not found   CHIEF COMPLAINT:  Chief Complaint  Patient presents with   Establish Care    New paitent.         HPI:  Discussed the use of AI scribe software for clinical note transcription with the patient, who gave verbal consent to proceed.  History of Present Illness   Brenda Bowen is a 53 year old female with hypertension who presents with elevated blood pressure after running out of medication.  She ran out of her blood pressure medication, leading to elevated blood pressure and a visit to the emergency department. At home, her diastolic pressure reached 'one sixty something', though she distrusts her home blood pressure machine due to its age. At the emergency department, her medication was refilled, and she is currently taking losartan  in a combined pill form.  Her past medication history includes metformin, phentermine, spironolactone , and estradiol , which she is no longer taking. Metformin and phentermine were discontinued due to ineffectiveness, spironolactone  was likely used for acne, and estradiol  was used when she was trying to conceive.  Her kidney function was previously noted to be elevated. She smokes about five cigarettes in the evening after work and a pack a day on weekends.  She has been monitoring her blood pressure at home. She mentions that her diet, including a high-salt meal before her emergency department visit, may have contributed to her elevated blood pressure.         OBJECTIVE:       12/02/2023    9:15 AM  Depression screen PHQ 2/9  Decreased Interest 0  Down, Depressed, Hopeless 0  PHQ - 2 Score 0     BP Readings from Last 3 Encounters:  12/02/23 130/80  11/19/23 (!) 161/99  01/24/19 (!) 144/82    BP 130/80   Pulse 78   Ht 5' 3 (1.6 m)   Wt 184 lb 12.8 oz (83.8 kg)   SpO2 97%   BMI 32.74 kg/m    Physical Exam   VITALS: BP- 130/80       Physical Exam Constitutional:      Appearance: Normal appearance.  Neurological:     General: No focal deficit present.     Mental Status: She is alert and oriented to person, place, and time. Mental status is at baseline.     ASSESSMENT/PLAN:   Assessment & Plan Primary hypertension  Obesity (BMI 30.0-34.9)  Decreased GFR    Assessment and Plan    Hypertension Recent exacerbation led to ED visit. Current BP improved but elevated. On losartan  and another antihypertensive. Potential kidney damage due to high BP. - Check A1c for blood sugar control. - Instruct to measure BP at home twice daily and record. - Review BP log in 2.5 weeks for medication adjustment.  Elevated creatinine (monitoring for kidney dysfunction) Elevated creatinine possibly due to uncontrolled hypertension. Need to determine if new baseline or transient issue.  Tobacco use disorder Smokes five cigarettes in the evening and a pack on weekends. Discussed smoking cessation benefits and reduction strategy. - Advise reduction by one cigarette per night and gradual weekend decrease. - Offer support and potential medication options if needed. Spent atleast 4 minutes discussing tobacco cessation techniques.   Obesity Obesity contributing to hypertension. Discussed weight loss benefits on BP control. - Encourage weight loss to manage BP.  Brenda Bowen A. Vita MD [facility]

## 2023-12-02 NOTE — Patient Instructions (Addendum)
 Please measure your blood pressure morning and evening for the next 2 weeks and write them down and bring them in your next 3 weeks.

## 2023-12-03 LAB — BASIC METABOLIC PANEL WITH GFR
BUN/Creatinine Ratio: 13 (ref 9–23)
BUN: 13 mg/dL (ref 6–24)
CO2: 22 mmol/L (ref 20–29)
Calcium: 9.7 mg/dL (ref 8.7–10.2)
Chloride: 102 mmol/L (ref 96–106)
Creatinine, Ser: 0.97 mg/dL (ref 0.57–1.00)
Glucose: 99 mg/dL (ref 70–99)
Potassium: 4 mmol/L (ref 3.5–5.2)
Sodium: 138 mmol/L (ref 134–144)
eGFR: 70 mL/min/1.73 (ref >59)

## 2023-12-14 ENCOUNTER — Other Ambulatory Visit (HOSPITAL_COMMUNITY): Payer: Self-pay

## 2023-12-15 ENCOUNTER — Other Ambulatory Visit (HOSPITAL_COMMUNITY): Payer: Self-pay

## 2023-12-23 ENCOUNTER — Other Ambulatory Visit (HOSPITAL_COMMUNITY): Payer: Self-pay

## 2023-12-23 ENCOUNTER — Ambulatory Visit: Admitting: Family Medicine

## 2023-12-23 ENCOUNTER — Encounter: Payer: Self-pay | Admitting: Family Medicine

## 2023-12-23 VITALS — BP 132/88 | HR 74 | Wt 182.0 lb

## 2023-12-23 DIAGNOSIS — I10 Essential (primary) hypertension: Secondary | ICD-10-CM

## 2023-12-23 MED ORDER — LOSARTAN POTASSIUM 50 MG PO TABS
50.0000 mg | ORAL_TABLET | Freq: Every day | ORAL | 0 refills | Status: DC
  Filled 2023-12-23 – 2024-01-16 (×2): qty 90, 90d supply, fill #0

## 2023-12-23 MED ORDER — HYDROCHLOROTHIAZIDE 25 MG PO TABS
25.0000 mg | ORAL_TABLET | Freq: Every day | ORAL | 0 refills | Status: DC
  Filled 2023-12-23: qty 90, 90d supply, fill #0

## 2023-12-23 MED ORDER — IBUPROFEN 600 MG PO TABS
600.0000 mg | ORAL_TABLET | Freq: Four times a day (QID) | ORAL | 0 refills | Status: DC | PRN
  Filled 2023-12-23: qty 30, 8d supply, fill #0

## 2023-12-23 NOTE — Progress Notes (Signed)
   Name: Brenda Bowen   Date of Visit: 12/23/23   Date of last visit with me: 12/02/2023   CHIEF COMPLAINT:  Chief Complaint  Patient presents with   Follow-up    3 week follow up.        HPI:  Discussed the use of AI scribe software for clinical note transcription with the patient, who gave verbal consent to proceed.  History of Present Illness   Brenda Bowen is a 53 year old female with hypertension who presents for blood pressure management and shoulder pain.  She has been taking hydrochlorothiazide  12.5 mg and Cozaar  for her hypertension, but her blood pressure remains elevated. She attributes this to stress and continues to smoke, which she acknowledges worsens with stress. 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  She reports pain in her arm, which she indicated during the visit. Tylenol  has not provided relief, and she has been taking ibuprofen  at a higher dose than recommended, which causes discomfort. She has not been using ice on the area.         OBJECTIVE:       12/02/2023    9:15 AM  Depression screen PHQ 2/9  Decreased Interest 0  Down, Depressed, Hopeless 0  PHQ - 2 Score 0     BP Readings from Last 3 Encounters:  12/23/23 132/88  12/02/23 130/80  11/19/23 (!) 161/99    BP 132/88   Pulse 74   Wt 182 lb (82.6 kg)   SpO2 97%   BMI 32.24 kg/m    Physical Exam          Physical Exam Constitutional:      Appearance: Normal appearance.  Neurological:     General: No focal deficit present.     Mental Status: She is alert and oriented to person, place, and time. Mental status is at baseline.     ASSESSMENT/PLAN:   Assessment & Plan Primary hypertension    Assessment and Plan    Right shoulder pain Right shoulder pain. Inadequate relief with Tylenol . - Prescribe ibuprofen  600 mg twice daily. - Advise icing the shoulder at least once daily for 20 minutes, more if  possible. - Schedule follow-up in 4 weeks to reassess shoulder pain. - Consider steroid injection if no improvement with current management.  Hypertension Hypertension not adequately controlled with current regimen. Blood pressure readings exceed target of less than 130/80 mmHg, recommended for her age group for long-term benefits. - Increase hydrochlorothiazide  to 25 mg daily. - Continue current dose of Cozaar . - Advise periodic blood pressure monitoring. - Schedule follow-up in 2 months to reassess blood pressure control.  Nicotine dependence Nicotine dependence exacerbated by stress from personal and work-related issues. - Encourage reduction in smoking. - Discuss potential benefits of smoking cessation on blood pressure and overall health.         Suhayb Anzalone A. Vita MD [facility]

## 2023-12-23 NOTE — Patient Instructions (Addendum)
 Ibuprofen  600mg  twice a day  Please ice 20 minutes, twice a day ideally

## 2024-01-16 ENCOUNTER — Other Ambulatory Visit: Payer: Self-pay | Admitting: Family Medicine

## 2024-01-16 ENCOUNTER — Other Ambulatory Visit (HOSPITAL_COMMUNITY): Payer: Self-pay

## 2024-01-16 DIAGNOSIS — I10 Essential (primary) hypertension: Secondary | ICD-10-CM

## 2024-01-17 ENCOUNTER — Other Ambulatory Visit (HOSPITAL_COMMUNITY): Payer: Self-pay

## 2024-01-17 MED ORDER — IBUPROFEN 600 MG PO TABS
600.0000 mg | ORAL_TABLET | Freq: Four times a day (QID) | ORAL | 0 refills | Status: AC | PRN
  Filled 2024-01-17: qty 30, 8d supply, fill #0

## 2024-01-27 ENCOUNTER — Encounter: Payer: Self-pay | Admitting: Family Medicine

## 2024-01-27 ENCOUNTER — Ambulatory Visit: Payer: Self-pay | Admitting: Family Medicine

## 2024-01-27 ENCOUNTER — Other Ambulatory Visit: Payer: Self-pay

## 2024-01-27 ENCOUNTER — Other Ambulatory Visit (HOSPITAL_COMMUNITY): Payer: Self-pay

## 2024-01-27 ENCOUNTER — Other Ambulatory Visit (INDEPENDENT_AMBULATORY_CARE_PROVIDER_SITE_OTHER): Payer: Self-pay

## 2024-01-27 VITALS — BP 128/82 | HR 77 | Wt 180.4 lb

## 2024-01-27 DIAGNOSIS — M67911 Unspecified disorder of synovium and tendon, right shoulder: Secondary | ICD-10-CM

## 2024-01-27 DIAGNOSIS — E559 Vitamin D deficiency, unspecified: Secondary | ICD-10-CM

## 2024-01-27 MED ORDER — LIDOCAINE HCL 1 % IJ SOLN
5.0000 mL | Freq: Once | INTRAMUSCULAR | Status: AC
  Administered 2024-01-27: 5 mL via INTRADERMAL

## 2024-01-27 MED ORDER — METHYLPREDNISOLONE ACETATE 40 MG/ML IJ SUSP
40.0000 mg | Freq: Once | INTRAMUSCULAR | Status: AC
  Administered 2024-01-27: 40 mg via INTRAMUSCULAR

## 2024-01-27 MED ORDER — VITAMIN D (ERGOCALCIFEROL) 1.25 MG (50000 UNIT) PO CAPS
50000.0000 [IU] | ORAL_CAPSULE | ORAL | 2 refills | Status: AC
  Filled 2024-01-27: qty 12, 84d supply, fill #0
  Filled 2024-04-11: qty 12, 84d supply, fill #1

## 2024-01-27 MED ORDER — BUPIVACAINE HCL 0.25 % IJ SOLN
2.0000 mL | Freq: Once | INTRAMUSCULAR | Status: AC
  Administered 2024-01-27: 2 mL

## 2024-01-27 NOTE — Progress Notes (Signed)
 Name: Brenda Bowen   Date of Visit: 01/27/24   Date of last visit with me: 01/16/2024   CHIEF COMPLAINT:  Chief Complaint  Patient presents with   Follow-up    4 week follow up for shoulder. Not too much better, doing ice and ibuprofen .        HPI:  Discussed the use of AI scribe software for clinical note transcription with the patient, who gave verbal consent to proceed.  History of Present Illness Brenda Bowen is a 53 year old female who presents with shoulder pain and weakness.  She experiences persistent shoulder pain that worsens with activities such as lifting and reaching, particularly at work. The pain is accompanied by weakness, making it difficult for her to raise her arm beyond a certain point without assistance.  She denies any specific injury but attributes the symptoms to repetitive activities. She describes the pain as stable since the last visit and mentions difficulty performing tasks like reaching behind her back.  She is currently transitioning to a new job at [employer] in [place].     OBJECTIVE:       12/02/2023    9:15 AM  Depression screen PHQ 2/9  Decreased Interest 0  Down, Depressed, Hopeless 0  PHQ - 2 Score 0     BP Readings from Last 3 Encounters:  01/27/24 128/82  12/23/23 132/88  12/02/23 130/80    BP 128/82   Pulse 77   Wt 180 lb 6.4 oz (81.8 kg)   SpO2 98%   BMI 31.96 kg/m    Physical Exam    Physical Exam  Inspection reveals no gross abnormalities of the right shoulder.  There is noted decreased range of motion with abduction between 30 and 70 degrees.  Patient unable to do this on her own and has to use her other arm to accommodate.  Significant weakness noted on the right side against abduction with resistance compared to the left.  External and internal rotation appear to be intact.  Positive empty cans, negative Neer's. ASSESSMENT/PLAN:   Assessment & Plan Vitamin D  deficiency  Rotator cuff  disorder, right    Assessment and Plan Assessment & Plan Right rotator cuff tear with associated right shoulder pain Chronic right shoulder pain with limited range of motion, likely due to rotator cuff tear. No acute injury, suggesting gradual onset from repetitive use. Differential includes arthritis, but significant range of motion loss indicates rotator cuff issue. - Administered steroid injection for pain relief, expected onset in two days, improvement over two weeks. - Provided shoulder strengthening exercises, to start Monday evenings after work. - Scheduled follow-up in six weeks to assess pain and function, determine need for MRI.  Vitamin D  deficiency, recurrent - Refill vitamin D  given hx of chronic vitamin D  deficiency - Fatigue could be related to fatigue, will re-evaluate Vitamin D  levels in approximately 3 months.  US -guided glenohumeral joint injection, right shoulder After discussion on risks/benefits/indications, informed verbal consent was obtained. A timeout was then performed. The patient was positioned lying lateral recumbent on examination table. The patient's shoulder was prepped with betadine and multiple alcohol swabs and utilizing ultrasound guidance, the patient's glenohumeral joint was identified on ultrasound. Using ultrasound guidance a 22-gauge, 3.5 inch needle with a mixture of 2:2:2 cc's lidocaine :bupivicaine:depomedrol was directed from a lateral to medial direction via in-plane technique into the glenohumeral joint with visualization of appropriate spread of injectate into the joint. Patient tolerated the procedure well without immediate complications.  Birttany Dechellis A. Vita MD [facility]

## 2024-03-19 ENCOUNTER — Other Ambulatory Visit (HOSPITAL_COMMUNITY): Payer: Self-pay

## 2024-03-19 ENCOUNTER — Other Ambulatory Visit: Payer: Self-pay | Admitting: Family Medicine

## 2024-03-19 DIAGNOSIS — I10 Essential (primary) hypertension: Secondary | ICD-10-CM

## 2024-03-19 MED ORDER — HYDROCHLOROTHIAZIDE 25 MG PO TABS
25.0000 mg | ORAL_TABLET | Freq: Every day | ORAL | 0 refills | Status: DC
  Filled 2024-03-19: qty 90, 90d supply, fill #0

## 2024-03-19 NOTE — Telephone Encounter (Signed)
 Copied from CRM [id number]. Topic: Clinical - Medication Refill >> Mar 19, 2024  2:22 PM Everette C wrote: Medication: Rx #: [id number]  hydrochlorothiazide  (HYDRODIURIL ) 25 MG tablet [[id number]]   Has the patient contacted their pharmacy? No (Agent: If no, request that the patient contact the pharmacy for the refill. If patient does not wish to contact the pharmacy document the reason why and proceed with request.) (Agent: If yes, when and what did the pharmacy advise?)  This is the patient's preferred pharmacy:  [facility] [address] [place] [address] Phone: [phone] Fax: [phone] Hours: M-F 7:30am-7:00p  Is this the correct pharmacy for this prescription? Yes If no, delete pharmacy and type the correct one.   Has the prescription been filled recently? Yes  Is the patient out of the medication? Yes  Has the patient been seen for an appointment in the last year OR does the patient have an upcoming appointment? Yes  Can we respond through MyChart? No  Agent: Please be advised that Rx refills may take up to 3 business days. We ask that you follow-up with your pharmacy.

## 2024-04-11 ENCOUNTER — Other Ambulatory Visit: Payer: Self-pay | Admitting: Family Medicine

## 2024-04-11 ENCOUNTER — Other Ambulatory Visit (HOSPITAL_COMMUNITY): Payer: Self-pay

## 2024-04-11 ENCOUNTER — Other Ambulatory Visit: Payer: Self-pay

## 2024-04-11 DIAGNOSIS — I10 Essential (primary) hypertension: Secondary | ICD-10-CM

## 2024-04-11 MED ORDER — HYDROCHLOROTHIAZIDE 25 MG PO TABS
25.0000 mg | ORAL_TABLET | Freq: Every day | ORAL | 0 refills | Status: AC
  Filled 2024-04-11: qty 90, 90d supply, fill #0

## 2024-04-11 MED ORDER — LOSARTAN POTASSIUM 50 MG PO TABS
50.0000 mg | ORAL_TABLET | Freq: Every day | ORAL | 0 refills | Status: AC
  Filled 2024-04-11: qty 90, 90d supply, fill #0

## 2024-04-23 ENCOUNTER — Other Ambulatory Visit (HOSPITAL_COMMUNITY): Payer: Self-pay
